# Patient Record
Sex: Female | Born: 1960 | Hispanic: Yes | Marital: Single
Health system: Southern US, Community
[De-identification: ages and names within clinical notes are randomized; demographics above are authoritative.]

## PROBLEM LIST (undated history)

## (undated) DIAGNOSIS — I340 Nonrheumatic mitral (valve) insufficiency: Secondary | ICD-10-CM

## (undated) DIAGNOSIS — I6529 Occlusion and stenosis of unspecified carotid artery: Secondary | ICD-10-CM

## (undated) DIAGNOSIS — I10 Essential (primary) hypertension: Secondary | ICD-10-CM

## (undated) HISTORY — PX: FINGER SURGERY: SHX640

## (undated) HISTORY — DX: Nonrheumatic mitral (valve) insufficiency: I34.0

## (undated) HISTORY — DX: Occlusion and stenosis of unspecified carotid artery: I65.29

---

## 2016-04-10 ENCOUNTER — Encounter: Payer: Self-pay | Admitting: Emergency Medicine

## 2016-04-10 ENCOUNTER — Emergency Department
Admission: EM | Admit: 2016-04-10 | Discharge: 2016-04-10 | Disposition: A | Discharge status: Home / Self Care | Payer: Self-pay | Attending: Emergency Medicine | Admitting: Emergency Medicine

## 2016-04-10 ENCOUNTER — Emergency Department: Payer: Self-pay

## 2016-04-10 DIAGNOSIS — I10 Essential (primary) hypertension: Secondary | ICD-10-CM | POA: Insufficient documentation

## 2016-04-10 DIAGNOSIS — Z79899 Other long term (current) drug therapy: Secondary | ICD-10-CM | POA: Insufficient documentation

## 2016-04-10 DIAGNOSIS — R202 Paresthesia of skin: Secondary | ICD-10-CM | POA: Insufficient documentation

## 2016-04-10 DIAGNOSIS — R000 Tachycardia, unspecified: Secondary | ICD-10-CM | POA: Insufficient documentation

## 2016-04-10 HISTORY — DX: Essential (primary) hypertension: I10

## 2016-04-10 LAB — BASIC METABOLIC PANEL
ANION GAP: 13 (ref 5–15)
BUN: 12 mg/dL (ref 6–20)
CALCIUM: 9.9 mg/dL (ref 8.9–10.3)
CO2: 23 mmol/L (ref 22–32)
Chloride: 101 mmol/L (ref 101–111)
Creatinine, Ser: 0.71 mg/dL (ref 0.44–1.00)
GFR calc non Af Amer: >60 mL/min (ref >60)
Glucose, Bld: 167 mg/dL — ABNORMAL HIGH (ref 65–99)
POTASSIUM: 2.6 mmol/L — AB (ref 3.5–5.1)
SODIUM: 137 mmol/L (ref 135–145)

## 2016-04-10 LAB — CBC
HEMATOCRIT: 44.2 % (ref 35.0–47.0)
HEMOGLOBIN: 15.1 g/dL (ref 12.0–16.0)
MCH: 30 pg (ref 26.0–34.0)
MCHC: 34.3 g/dL (ref 32.0–36.0)
MCV: 87.5 fL (ref 80.0–100.0)
Platelets: 266 10*3/uL (ref 150–440)
RBC: 5.05 MIL/uL (ref 3.80–5.20)
RDW: 13 % (ref 11.5–14.5)
WBC: 10 10*3/uL (ref 3.6–11.0)

## 2016-04-10 LAB — URINALYSIS, COMPLETE (UACMP) WITH MICROSCOPIC
Bilirubin Urine: NEGATIVE
Glucose, UA: NEGATIVE mg/dL
HGB URINE DIPSTICK: NEGATIVE
KETONES UR: NEGATIVE mg/dL
Nitrite: NEGATIVE
PH: 6 (ref 5.0–8.0)
Protein, ur: NEGATIVE mg/dL
SPECIFIC GRAVITY, URINE: 1.01 (ref 1.005–1.030)

## 2016-04-10 MED ORDER — ATENOLOL 25 MG PO TABS
25.0000 mg | ORAL_TABLET | Freq: Once | ORAL | Status: AC
  Administered 2016-04-10: 25 mg via ORAL
  Filled 2016-04-10: qty 1

## 2016-04-10 MED ORDER — POTASSIUM CHLORIDE CRYS ER 20 MEQ PO TBCR
40.0000 meq | EXTENDED_RELEASE_TABLET | Freq: Once | ORAL | Status: AC
  Administered 2016-04-10: 40 meq via ORAL
  Filled 2016-04-10: qty 2

## 2016-04-10 MED ORDER — SODIUM CHLORIDE 0.9 % IV BOLUS (SEPSIS)
1000.0000 mL | Freq: Once | INTRAVENOUS | Status: AC
Start: 2016-04-10 — End: 2016-04-10
  Administered 2016-04-10: 1000 mL via INTRAVENOUS

## 2016-04-10 MED ORDER — ATENOLOL 25 MG PO TABS: 25.0000 mg | ORAL_TABLET | Freq: Every day | ORAL | 11 refills | Status: AC

## 2016-04-10 MED ORDER — SODIUM CHLORIDE 0.9 % IV BOLUS (SEPSIS)
1000.0000 mL | Freq: Once | INTRAVENOUS | Status: AC
  Administered 2016-04-10: 1000 mL via INTRAVENOUS

## 2016-04-10 NOTE — Discharge Instructions (Signed)
Please seek medical attention for any high fevers, chest pain, shortness of breath, change in behavior, persistent vomiting, bloody stool or any other new or concerning symptoms.  

## 2016-04-10 NOTE — ED Notes (Signed)
Interpretor requested for MD to update pt

## 2016-04-10 NOTE — ED Provider Notes (Signed)
[facility] Emergency Department Provider Note   ____________________________________________   I have reviewed the triage vital signs and the nursing notes.   HISTORY  Chief Complaint Numbness   History limited by: Language [facility] utilized   HPI Victoria Alvarado is a 56 y.o. female who presents to the emergency department today because of concerns for left sided paresthesias and headache. Patient states that she has had on and off left foot numbness and tingling. Sounds like this has been going on for some years. For the past week however the numbness has been constant. She describes it as a sensation of her foot falling asleep. During the same time she has had intermittent left upper extremity tingling. The patient additionally has had a headache on the left side for the past 2 days. She did get her primary care doctor yesterday and was prescribed medication for high blood pressure. No recent fevers.   Past Medical History:  Diagnosis Date  . Hypertension     There are no active problems to display for this patient.   History reviewed. No pertinent surgical history.  Prior to Admission medications   Not on File    Allergies Patient has no known allergies.  History reviewed. No pertinent family history.  Social History Social History  Substance Use Topics  . Smoking status: Never Smoker  . Smokeless tobacco: Never Used  . Alcohol use No    Review of Systems  Constitutional: Negative for fever. Cardiovascular: Negative for chest pain. Respiratory: Negative for shortness of breath. Gastrointestinal: Negative for abdominal pain, vomiting and diarrhea. Genitourinary: Negative for dysuria. Musculoskeletal: Negative for back pain. Skin: Negative for rash. Neurological: Positive for headache. Positive for left upper and lower extremity paraesthesias.   10-point ROS otherwise  negative.  ____________________________________________   PHYSICAL EXAM:  VITAL SIGNS: ED Triage Vitals  Enc Vitals Group     BP 04/10/16 1324 (!) 195/105     Pulse Rate 04/10/16 1324 (!) 140     Resp 04/10/16 1324 18     Temp 04/10/16 1324 98.6 F (37 C)     Temp Source 04/10/16 1324 Oral     SpO2 04/10/16 1324 100 %     Weight 04/10/16 1325 159 lb (72.1 kg)     Height 04/10/16 1325 5\' 3"  (1.6 m)     Head Circumference --      Peak Flow --      Pain Score 04/10/16 1323 7   Constitutional: Alert and oriented. Well appearing and in no distress. Eyes: Conjunctivae are normal. Normal extraocular movements. ENT   Head: Normocephalic and atraumatic.   Nose: No congestion/rhinnorhea.   Mouth/Throat: Mucous membranes are moist.   Neck: No stridor. Hematological/Lymphatic/Immunilogical: No cervical lymphadenopathy. Cardiovascular: Tachycardic, regular rhythm.  No murmurs, rubs, or gallops.  Respiratory: Normal respiratory effort without tachypnea nor retractions. Breath sounds are clear and equal bilaterally. No wheezes/rales/rhonchi. Gastrointestinal: Soft and non tender. No rebound. No guarding.  Genitourinary: Deferred Musculoskeletal: Normal range of motion in all extremities. No lower extremity edema. Neurologic:  Normal speech and language. No gross focal neurologic deficits are appreciated.  Skin:  Skin is warm, dry and intact. No rash noted. Psychiatric: Mood and affect are normal. Speech and behavior are normal. Patient exhibits appropriate insight and judgment.  ____________________________________________    LABS (pertinent positives/negatives)  Labs Reviewed  BASIC METABOLIC PANEL - Abnormal; Notable for the following:       Result Value  Potassium 2.6 (*)    Glucose, Bld 167 (*)    All other components within normal limits  URINALYSIS, COMPLETE (UACMP) WITH MICROSCOPIC - Abnormal; Notable for the following:    Color, Urine YELLOW (*)     APPearance CLEAR (*)    Leukocytes, UA TRACE (*)    Bacteria, UA RARE (*)    Squamous Epithelial / LPF 0-5 (*)    All other components within normal limits  CBC  CBG MONITORING, ED     ____________________________________________   EKG  I, Phineas Semen, attending physician, personally viewed and interpreted this EKG  EKG Time: 1331 Rate: 131 Rhythm: sinus tachycardia Axis: normal Intervals: qtc 428 QRS: narrow ST changes: no st elevation Impression: abnormal ekg   ____________________________________________    RADIOLOGY  CT head IMPRESSION:  No acute intracranial abnormality. 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     ____________________________________________   PROCEDURES  Procedures  ____________________________________________   INITIAL IMPRESSION / ASSESSMENT AND PLAN / ED COURSE  Pertinent labs & imaging results that were available during my care of the patient were reviewed by me and considered in my medical decision making (see chart for details).  Patient presented to the emergency department today with concerns for paresthesias. At CT here showed L although no other acute findings. Patient did have a low potassium and was given potassium repletion. She stated she did feel better over the course of her stay here in the emergency department. Of note her heart rate did continue to remain high. This point no concerning findings for infection. It did improve slightly with fluids however did start atenolol and instruct patient follow up with cardiology.  ____________________________________________   FINAL CLINICAL IMPRESSION(S) / ED DIAGNOSES  Final diagnoses:  Paresthesia  Tachycardia     Note: This dictation was prepared with Dragon dictation. Any transcriptional errors that result from this process are unintentional     Phineas Semen, MD 04/10/16 2026

## 2016-04-10 NOTE — ED Notes (Signed)
Discharge done with assistance of Nadean CorwinHiram Cutie, [place] interpreter.

## 2016-04-10 NOTE — ED Notes (Signed)
Date and time results received: 04/10/16 1402 (use smartphrase ".now" to insert current time)  Test: potassium Critical Value: 2.6  Name of Provider Notified: goodman

## 2016-04-10 NOTE — ED Triage Notes (Signed)
Pt to ed with c/o numbness and tingling in left foot and left hand that started about 1 week ago, then headache that started 2 days ago, worse today on left side of head. Pt states started HTN meds yesterday.

## 2016-04-10 NOTE — ED Notes (Signed)
Pt discharged to home.  Discharge instructions reviewed.  Verbalized understanding.  No questions or concerns at this time.  Teach back verified.  Pt in NAD.  No items left in ED.   

## 2016-04-12 ENCOUNTER — Encounter: Payer: Self-pay | Admitting: Internal Medicine

## 2016-04-12 ENCOUNTER — Ambulatory Visit (INDEPENDENT_AMBULATORY_CARE_PROVIDER_SITE_OTHER): Payer: Self-pay | Admitting: Internal Medicine

## 2016-04-12 VITALS — BP 128/78 | HR 77 | Ht 63.0 in | Wt 157.8 lb

## 2016-04-12 DIAGNOSIS — R202 Paresthesia of skin: Secondary | ICD-10-CM

## 2016-04-12 DIAGNOSIS — R000 Tachycardia, unspecified: Secondary | ICD-10-CM

## 2016-04-12 NOTE — Progress Notes (Signed)
New Outpatient Visit Date: 04/12/2016  Referring Provider: Phineas Semen, MD [facility] Emergency Department  Chief Complaint: elevated heart rate  HPI:  Ms. Victoria Alvarado is a 56 y.o. year-old female with history of hypertension, who has been referred by Dr. Derrill Kay for evaluation of sinus tachycardia. The patient presented to the [facility] ED on 04/10/16 with paresthesias. She was noted to have an elevated heart rate with sinus tachycardia. Workup was notable for hypokalemia. Heart rate improved with IV fluids. The patient was discharged on atenolol and referred for cardiology evaluation. The patient reports numbness and tingling involving the left arm and leg that began last week. She has also experienced intermittent sharp pains along the left side of her head. Two days ago, while at work, the patient began feeling "odd." She also felt as though her heart was racing. She left early and presented to the [facility] ED, where evaluation was notable for sinus tachycardia in the setting of hypokalemia. Head CT without contrast showed enlargement of the sella turcica compatible with empty sella but no acute abnormalities. The patient received IV fluids and potassium; she was sent home on atenolol.  Of note, the patient had recently been started on HCTZ and metonidazole for hypertension and vaginal infection by her PCP. She had not consumed EtOH since beginning these medications. She denies chest pain,lightheadedness, edema, orthopnea, PND, and claudication. However, she has experienced mild shortness of breath when she gets "agitated" or during intercourse. She has not had any heart problems in the past. She denies prior cardiovascular work-up. She denies focal weakness and amaurosis fugax. She consumes 2 cups of caffeinated coffee per day.  --------------------------------------------------------------------------------------------------  Cardiovascular History & Procedures: Cardiovascular Problems:  Sinus  tachycardia  Risk Factors:  Hypertension  Cath/PCI:  None  CV Surgery:  None  EP Procedures and Devices:  None  Non-Invasive Evaluation(s):  None  Recent CV Pertinent Labs: Lab Results  Component Value Date   K 2.6 (LL) 04/10/2016   BUN 12 04/10/2016   CREATININE 0.71 04/10/2016    --------------------------------------------------------------------------------------------------  Past Medical History:  Diagnosis Date  . Hypertension     Past Surgical History:  Procedure Laterality Date  . FINGER SURGERY      Outpatient Encounter Prescriptions as of 04/12/2016  Medication Sig  . atenolol (TENORMIN) 25 MG tablet Take 1 tablet (25 mg total) by mouth daily.  . hydrochlorothiazide (MICROZIDE) 12.5 MG capsule Take 12.5 mg by mouth daily.  . metroNIDAZOLE (FLAGYL) 500 MG tablet Take 500 mg by mouth 2 (two) times daily.   No facility-administered encounter medications on file as of 04/12/2016.     Allergies: Patient has no known allergies.  Social History   Social History  . Marital status: Single    Spouse name: N/A  . Number of children: N/A  . Years of education: N/A   Occupational History  . Not on file.   Social History Main Topics  . Smoking status: Passive Smoke Exposure - Never Smoker  . Smokeless tobacco: Never Used  . Alcohol use Yes     Comment: occassion  . Drug use: No  . Sexual activity: Not on file   Other Topics Concern  . Not on file   Social History Narrative  . No narrative on file    Family History  Problem Relation Age of Onset  . Heart Problems Mother     Review of Systems: A 12-system review of systems was performed and was negative except as noted in the HPI.  --------------------------------------------------------------------------------------------------  Physical Exam: BP 128/78 (BP Location: Right Arm, Patient Position: Sitting, Cuff Size: Normal)   Pulse 77   Ht 5\' 3"  (1.6 m)   Wt 157 lb 12 oz (71.6 kg)    BMI 27.94 kg/m   General:  Overweight woman, seated comfortably in the exam room. She is accompanied by a [place] interpreter. HEENT: No conjunctival pallor or scleral icterus.  Moist mucous membranes.  OP clear. Neck: Supple without lymphadenopathy, thyromegaly, JVD, or HJR.  No carotid bruit. Lungs: Normal work of breathing.  Clear to auscultation bilaterally without wheezes or crackles. Heart: Regular rate and rhythm without murmurs, rubs, or gallops.  Non-displaced PMI. Abd: Bowel sounds present.  Soft, NT/ND without hepatosplenomegaly Ext: No lower extremity edema.  Radial, PT, and DP pulses are 2+ bilaterally Skin: warm and dry without rash Neuro: CNIII-XII intact.  Decreased fine-touch sensation in the left upper and lower extremities. 5/5 strength in the upper and lower extremities. Psych: Normal mood and affect.  EKG:  Normal sinus rhythm (HR 77 bpm) without abnormalities. Heart rate has decreased from prior tracing on 04/11/11 (I have personally reviewed both tracings).  Lab Results  Component Value Date   WBC 10.0 04/10/2016   HGB 15.1 04/10/2016   HCT 44.2 04/10/2016   MCV 87.5 04/10/2016   PLT 266 04/10/2016    Lab Results  Component Value Date   NA 137 04/10/2016   K 2.6 (LL) 04/10/2016   CL 101 04/10/2016   CO2 23 04/10/2016   BUN 12 04/10/2016   CREATININE 0.71 04/10/2016   GLUCOSE 167 (H) 04/10/2016    No results found for: CHOL, HDL, LDLCALC, LDLDIRECT, TRIG, CHOLHDL  --------------------------------------------------------------------------------------------------  ASSESSMENT AND PLAN: Sinus tachycardia Heart rate is normal today, albeit with the patient on a beta-blocker. I am not sure what precipitated her episode of sinus tachycardia earlier this week. However, sinus tachycardia is typically a response to a physiologic or emotional stressor, rather than a primary cardiac rhythm disturbance. Given recent addition of HCTZ and hypokalemia on admission, it  is possible that intravascular volume depletion and electrolyte disturbances may have been contributing to this. We will repeat a CMP today and also check a Mg level and TSH. We will obtain a transthoracic echocardiogram to exclude structural abnormalities, given the patient's report of mild shortness of breath. Her exam today is unrevealing without murmurs or signs of heart failure.  Paresthesias This has been present for about a week without clear precipitant. Electrolyte abnormalities could have contributed. Physical exam is notable only for decreased fine-touch sensation throughout the left side of her body. There is no weakness or other focal neurologic deficit. We have agreed to obtain carotid Dopplers, TSH, CMP, and hemoglobin A1c (in case this could be an unusual presentation of early diabetic neuropathy). I will defer further work-up to the patient's PCP, including suggestion of empty sella noted on recent head CT.  Follow-up: Return to clinic in 4-6 weeks.  Yvonne Kendallhristopher Bernell Haynie, MD 04/12/2016 9:23 AM

## 2016-04-12 NOTE — Patient Instructions (Addendum)
Medication Instructions:  Your physician recommends that you continue on your current medications as directed. Please refer to the Current Medication list given to you today.   Labwork: Your physician recommends that you return for lab work in: TODAY (CMP, Mg, TSH, Hgb A1c).   Testing/Procedures: Your physician has requested that you have an echocardiogram. Echocardiography is a painless test that uses sound waves to create images of your heart. It provides your doctor with information about the size and shape of your heart and how well your heart's chambers and valves are working. This procedure takes approximately one hour. There are no restrictions for this procedure.  Your physician has requested that you have a carotid duplex. This test is an ultrasound of the carotid arteries in your neck. It looks at blood flow through these arteries that supply the brain with blood. Allow one hour for this exam. There are no restrictions or special instructions.    Follow-Up: Your physician recommends that you schedule a follow-up appointment in: 4-6 WEEKS AFTER TESTING COMPLETED.   Ecocardiograma (Echocardiogram) El ecocardiograma utiliza ondas sonoras (ultrasonido) para obtener una imagen del corazn. El ecocardiograma es simple e indoloro, se obtiene en un perodo de tiempo breve y proporciona informacin valiosa a su mdico. Las imgenes de un ecocardiograma pueden proporcionar el siguiente tipo de informacin:  Evidencia de enfermedad arterial coronaria (EAC).  Tamao del corazn.  Funcin del msculo cardaco.  Funcin de la vlvula cardaca.  Deteccin de aneurisma.  Evidencia de un infarto de miocardio pasado.  Acumulacin de lquido alrededor del corazn.  Engrosamiento del msculo cardaco.  Evaluacin de la funcin de la vlvula cardaca. INFORME A SU MDICO:  Cualquier alergia que tenga.  Todos los Walt Disneymedicamentos que utiliza, incluidos vitaminas, hierbas, gotas  oftlmicas, cremas y [address]icamentos de [address] libre.  Problemas previos que usted o los Graybar Electricmiembros de su familia hayan tenido con el uso de anestsicos.  Enfermedades de la sangre que tenga.  Cirugas previas.  Enfermedades que tenga.  Posibilidad de embarazo, si corresponde. ANTES DEL PROCEDIMIENTO No se requiere una preparacin especial. Coma y beba con normalidad. PROCEDIMIENTO  Para lograr una imagen del corazn, se aplicar un gel en el pecho y luego se pasar un instrumento similar a una vara (transductor) sobre este. El gel ayudar a transmitir las Corning Incorporatedondas sonoras del transductor. Las ondas sonoras rebotarn inofensivamente en el corazn para permitir capturar las imgenes del corazn en movimiento, en tiempo real. Luego, las imgenes se Agricultural engineerregistrarn.  Quiz necesite una va IV para recibir un medicamento que mejore la calidad de las imgenes. DESPUS DEL PROCEDIMIENTO Puede retomar su rutina normal, incluidos la dieta, las actividades y Pulte Homeslos medicamentos, a menos que su mdico le indique lo contrario. Esta informacin no tiene Theme park managercomo fin reemplazar el consejo del mdico. Asegrese de hacerle al mdico cualquier pregunta que tenga. Document Released: 01/02/2005 Document Revised: 01/23/2014 Document Reviewed: 09/09/2012 Elsevier Interactive Patient Education  2017 Elsevier Inc.    Echocardiogram An echocardiogram, or echocardiography, uses sound waves (ultrasound) to produce an image of your heart. The echocardiogram is simple, painless, obtained within a short period of time, and offers valuable information to your health care provider. The images from an echocardiogram can provide information such as:  Evidence of coronary artery disease (CAD).  Heart size.  Heart muscle function.  Heart valve function.  Aneurysm detection.  Evidence of a past heart attack.  Fluid buildup around the heart.  Heart muscle thickening.  Assess heart valve function. Tell a health care  provider  about:  Any allergies you have.  All medicines you are taking, including vitamins, herbs, eye drops, creams, and over-the-counter medicines.  Any problems you or family members have had with anesthetic medicines.  Any blood disorders you have.  Any surgeries you have had.  Any medical conditions you have.  Whether you are pregnant or may be pregnant. What happens before the procedure? No special preparation is needed. Eat and drink normally. What happens during the procedure?  In order to produce an image of your heart, gel will be applied to your chest and a wand-like tool (transducer) will be moved over your chest. The gel will help transmit the sound waves from the transducer. The sound waves will harmlessly bounce off your heart to allow the heart images to be captured in real-time motion. These images will then be recorded.  You may need an IV to receive a medicine that improves the quality of the pictures. What happens after the procedure? You may return to your normal schedule including diet, activities, and medicines, unless your health care provider tells you otherwise. This information is not intended to replace advice given to you by your health care provider. Make sure you discuss any questions you have with your health care provider. Document Released: 12/31/1999 Document Revised: 08/21/2015 Document Reviewed: 09/09/2012 Elsevier Interactive Patient Education  2017 [employer].

## 2016-04-13 DIAGNOSIS — R000 Tachycardia, unspecified: Secondary | ICD-10-CM | POA: Insufficient documentation

## 2016-04-13 DIAGNOSIS — R202 Paresthesia of skin: Secondary | ICD-10-CM | POA: Insufficient documentation

## 2016-04-13 LAB — COMPREHENSIVE METABOLIC PANEL
ALBUMIN: 4.7 g/dL (ref 3.5–5.5)
ALK PHOS: 136 IU/L — AB (ref 39–117)
ALT: 32 IU/L (ref 0–32)
AST: 27 IU/L (ref 0–40)
Albumin/Globulin Ratio: 1.6 (ref 1.2–2.2)
BILIRUBIN TOTAL: 0.3 mg/dL (ref 0.0–1.2)
BUN/Creatinine Ratio: 18 (ref 9–23)
BUN: 13 mg/dL (ref 6–24)
CALCIUM: 9.8 mg/dL (ref 8.7–10.2)
CO2: 21 mmol/L (ref 18–29)
CREATININE: 0.72 mg/dL (ref 0.57–1.00)
Chloride: 101 mmol/L (ref 96–106)
GFR calc non Af Amer: 95 mL/min/{1.73_m2} (ref >59)
GFR, EST AFRICAN AMERICAN: 110 mL/min/{1.73_m2} (ref >59)
GLOBULIN, TOTAL: 2.9 g/dL (ref 1.5–4.5)
Glucose: 128 mg/dL — ABNORMAL HIGH (ref 65–99)
Potassium: 3.8 mmol/L (ref 3.5–5.2)
Sodium: 141 mmol/L (ref 134–144)
TOTAL PROTEIN: 7.6 g/dL (ref 6.0–8.5)

## 2016-04-13 LAB — MAGNESIUM: MAGNESIUM: 1.9 mg/dL (ref 1.6–2.3)

## 2016-04-13 LAB — TSH: TSH: 3.42 u[IU]/mL (ref 0.450–4.500)

## 2016-04-13 LAB — HEMOGLOBIN A1C
Est. average glucose Bld gHb Est-mCnc: 120 mg/dL
Hgb A1c MFr Bld: 5.8 % — ABNORMAL HIGH (ref 4.8–5.6)

## 2016-05-01 ENCOUNTER — Ambulatory Visit
Admission: RE | Admit: 2016-05-01 | Discharge: 2016-05-01 | Disposition: A | Discharge status: Home / Self Care | Payer: Self-pay | Source: Ambulatory Visit | Attending: Oncology | Admitting: Oncology

## 2016-05-01 ENCOUNTER — Ambulatory Visit: Payer: Self-pay | Attending: Oncology

## 2016-05-01 VITALS — BP 126/78 | HR 73 | Temp 98.4°F | Ht 61.0 in | Wt 156.0 lb

## 2016-05-01 DIAGNOSIS — Z0000 Encounter for general adult medical examination without abnormal findings: Secondary | ICD-10-CM

## 2016-05-01 NOTE — Progress Notes (Signed)
Subjective:     Patient ID: Victoria Alvarado, female   DOB: 06-08-1960, 56 y.o.   MRN: [id number]  HPI   Review of Systems     Objective:   Physical Exam  Pulmonary/Chest: Right breast exhibits no inverted nipple, no mass, no nipple discharge, no skin change and no tenderness. Left breast exhibits no inverted nipple, no mass, no nipple discharge, no skin change and no tenderness. Breasts are symmetrical.    Red flat spot mid sternum; two circular bruises outer left breast       Assessment:     56 year old patient presents for BCCCP clinic visitPatient screened, and meets BCCCP eligibility.  Patient does not have insurance, Medicare or Medicaid.  Handout given on Affordable Care Act. Instructed patient on breast self-exam using teach back method.  CBE unremarkable.  Patient reports mid-strnal red area "has always been there".  States circular bruising on left breast has been there one week. Denies any breast injury, but does heavy lifting at work, and reports some back pain.   No mass or lump palpated. She is seeing her primary physician next Monday.  Intstructed to follow-up with PCP.    Plan:     Sent for bilateral screening mammogram.

## 2016-05-15 ENCOUNTER — Other Ambulatory Visit: Payer: Self-pay | Admitting: Internal Medicine

## 2016-05-15 DIAGNOSIS — R202 Paresthesia of skin: Secondary | ICD-10-CM

## 2016-05-16 ENCOUNTER — Other Ambulatory Visit: Payer: Self-pay | Admitting: *Deleted

## 2016-05-16 DIAGNOSIS — N63 Unspecified lump in unspecified breast: Secondary | ICD-10-CM

## 2016-05-16 DIAGNOSIS — I340 Nonrheumatic mitral (valve) insufficiency: Secondary | ICD-10-CM

## 2016-05-16 HISTORY — DX: Nonrheumatic mitral (valve) insufficiency: I34.0

## 2016-05-18 ENCOUNTER — Ambulatory Visit
Admission: RE | Admit: 2016-05-18 | Discharge: 2016-05-18 | Disposition: A | Discharge status: Home / Self Care | Payer: Self-pay | Source: Ambulatory Visit | Attending: Oncology | Admitting: Oncology

## 2016-05-18 DIAGNOSIS — N63 Unspecified lump in unspecified breast: Secondary | ICD-10-CM

## 2016-05-22 ENCOUNTER — Ambulatory Visit: Payer: Self-pay

## 2016-05-22 DIAGNOSIS — R202 Paresthesia of skin: Secondary | ICD-10-CM

## 2016-05-22 DIAGNOSIS — R000 Tachycardia, unspecified: Secondary | ICD-10-CM

## 2016-05-22 LAB — ECHOCARDIOGRAM COMPLETE
CHL CUP STROKE VOLUME: 51 mL
CHL CUP TV REG PEAK VELOCITY: 198 cm/s
E decel time: 238 msec
E/e' ratio: 8.28
FS: 40 % (ref 28–44)
IV/PV OW: 1.1
LA ID, A-P, ES: 26 mm
LA diam index: 1.53 cm/m2
LA vol A4C: 51.7 ml
LAVOL: 54.8 mL
LAVOLIN: 32.2 mL/m2
LDCA: 2.84 cm2
LEFT ATRIUM END SYS DIAM: 26 mm
LV E/e' medial: 8.28
LV E/e'average: 8.28
LV TDI E'LATERAL: 11.6
LV dias vol index: 37 mL/m2
LV sys vol index: 7 mL/m2
LV sys vol: 12 mL — AB (ref 14–42)
LVDIAVOL: 62 mL (ref 46–106)
LVELAT: 11.6 cm/s
LVOT VTI: 26.5 cm
LVOT diameter: 19 mm
LVOTSV: 75 mL
Lateral S' vel: 13.2 cm/s
MV Dec: 238
MV Peak grad: 4 mmHg
MV pk A vel: 82.5 m/s
MV pk E vel: 96 m/s
PW: 10 mm — AB (ref 0.6–1.1)
Simpson's disk: 81
TAPSE: 26.5 mm
TDI e' medial: 10.1
TR max vel: 198 cm/s

## 2016-05-22 LAB — VAS US CAROTID
LCCAPDIAS: 20 cm/s
LEFT ECA DIAS: -15 cm/s
LEFT VERTEBRAL DIAS: 15 cm/s
Left CCA dist dias: 23 cm/s
Left CCA dist sys: 84 cm/s
Left CCA prox sys: 107 cm/s
Left ICA dist dias: -46 cm/s
Left ICA dist sys: -118 cm/s
Left ICA prox dias: -20 cm/s
Left ICA prox sys: -66 cm/s
RCCAPDIAS: 0 cm/s
RIGHT ECA DIAS: -14 cm/s
RIGHT VERTEBRAL DIAS: -15 cm/s
Right CCA prox sys: -157 cm/s
Right cca dist sys: -95 cm/s

## 2016-05-24 NOTE — Progress Notes (Unsigned)
Letter mailed from [facility] to notify of normal mammogram results.  Patient to return in one year for annual screening.  Copy to [facility]. 

## 2016-05-31 ENCOUNTER — Ambulatory Visit (INDEPENDENT_AMBULATORY_CARE_PROVIDER_SITE_OTHER): Payer: Self-pay | Admitting: Internal Medicine

## 2016-05-31 ENCOUNTER — Encounter: Payer: Self-pay | Admitting: Internal Medicine

## 2016-05-31 VITALS — BP 140/78 | HR 84 | Ht 63.0 in | Wt 147.0 lb

## 2016-05-31 DIAGNOSIS — I6521 Occlusion and stenosis of right carotid artery: Secondary | ICD-10-CM

## 2016-05-31 DIAGNOSIS — I10 Essential (primary) hypertension: Secondary | ICD-10-CM

## 2016-05-31 DIAGNOSIS — Z13220 Encounter for screening for lipoid disorders: Secondary | ICD-10-CM

## 2016-05-31 DIAGNOSIS — R000 Tachycardia, unspecified: Secondary | ICD-10-CM

## 2016-05-31 DIAGNOSIS — R0789 Other chest pain: Secondary | ICD-10-CM

## 2016-05-31 DIAGNOSIS — R002 Palpitations: Secondary | ICD-10-CM

## 2016-05-31 MED ORDER — ASPIRIN EC 81 MG PO TBEC: 81.0000 mg | DELAYED_RELEASE_TABLET | Freq: Every day | ORAL | 3 refills | Status: DC

## 2016-05-31 NOTE — Patient Instructions (Addendum)
Medication Instructions:  Your physician has recommended you make the following change in your medication:  1- START TAKING Aspirin 81 mg by mouth once a day.   Labwork: Your physician recommends that you return for lab work in: TODAY (check cholesterol.).   Testing/Procedures: Your physician has requested that you have an exercise tolerance test. For further information please visit [email or web address]. Please also follow instruction sheet, as given.  - DO NOT TAKE YOUR Atenolol the night before or morning of the test.   DO NOT drink or eat foods with caffeine for 24 hours before the test. (Chocolate, coffee, tea, decaf coffee/tea, or energy drinks)  DO NOT smoke for 4 hours before your test.  If you use an inhaler, bring it with you to the test.  Wear comfortable shoes and clothing. Women do not wear dresses.    Follow-Up: Your physician recommends that you schedule a follow-up appointment in: 2 MONTHS WITH DR END.  If you need a refill on your cardiac medications before your next appointment, please call your pharmacy.   Electrocardiograma de esfuerzo (Exercise Stress Electrocardiogram) Un electrocardiograma de esfuerzo es un estudio para controlar cmo circula la sangre hacia el corazn. El objetivo es buscar zonas con un flujo sanguneo deficiente. En Regions Financial Corporationeste estudio, deber caminar sobre una cinta Soliscaminadora. El Customer service managerelectrocardiograma registrar los latidos cardacos cuando est en reposo y cuando haga ejercicio. ANTES DEL PROCEDIMIENTO  No consuma bebidas ni alimentos con cafena durante las 24horas previas al estudio, o como se lo haya indicado el mdico. Esto incluye caf, t (incluso t descafeinado), gaseosas, chocolate y cacao.  Siga las instrucciones del mdico respecto de lo que debe comer y beber antes del [place]estudio.  Consulte a su mdico qu medicamentos debe o no debe tomar antes del estudio. Tome los medicamentos con agua, a menos que el mdico le indique lo  contrario.  Si [place]ausa un inhalador, Nurse, mental healthtrigalo en el momento del [place]estudio.  Traiga un bocadillo para comer despus de la prueba.  No fume durante las 4horas previas al estudio.  No se aplique lociones, polvos, cremas ni aceites en el pecho antes del estudio.  Use ropa y calzado cmodos. PROCEDIMIENTO  Se le colocarn parches sobre el pecho. Es posible que se deban afeitar pequeas zonas del pecho. Se conectarn cables a los parches.  Se controlar su frecuencia cardaca mientras est en reposo y mientras haga ejercicio.  Caminar sobre una Latviacinta caminadora. La cinta caminadora ir aumentando la velocidad lentamente para elevar su frecuencia cardaca.  El estudio durar aproximadamente entre 1 y Multimedia programmer2horas. DESPUS DEL PROCEDIMIENTO  Despus del estudio, se le controlar la frecuencia cardaca y la presin arterial.  Puede retomar una dieta normal, las actividades y los medicamentos, segn las indicaciones del mdico. Esta informacin no tiene Theme park managercomo fin reemplazar el consejo del mdico. Asegrese de hacerle al mdico cualquier pregunta que tenga. Document Released: 12/16/2007 Document Revised: 01/23/2014 Document Reviewed: 09/09/2012 Elsevier Interactive Patient Education  2017 [employer].

## 2016-05-31 NOTE — Progress Notes (Signed)
Follow-up Outpatient Visit Date: 05/31/2016  Primary Care Provider: Herminio Commons, [address] [place] [id number]  Chief Complaint: Follow-up elevated heart rate  HPI:  Ms. Victoria Alvarado is a 56 y.o. year-old female with history of hypertension, who presents for follow-up of sinus tachycardia. I first met Ms. Jose Persia on 04/12/16 after presenting to the [facility] ED with paresthesias. She was noted to have hypokalemia and sinus tachycardia, which improved with IV fluids. We agreed to proceed with TTE and carotid Dopplers (see details below). Since her last visit, the patient has continued to have occasional chest pressure and palpitations. She typically experiences this when she lays on her left side at night. It resolves with moving to a different position. She also feels anxious with vague chest discomfort at times during the day. It is not clearly exertional. She has not had any shortness of breath. She was seen at [facility] after her last visit due to tingling in both arms. Workup at the time, including brain MRI, were unrevealing. Despite a largely negative workup thus far, the patient remains very anxious about her health. She remains on atenolol and HCTZ, which she is tolerating without side effects.  --------------------------------------------------------------------------------------------------  Cardiovascular History & Procedures: Cardiovascular Problems:  Sinus tachycardia  Atypical chest pain  Risk Factors:  Hypertension  Cath/PCI:  None  CV Surgery:  None  EP Procedures and Devices:  None  Non-Invasive Evaluation(s):  TTE (05/22/16): Normal LV size and function with LVEF of 60-65%. No wall motion abnormalities or diastolic dysfunction. Mild MR. Normal RV size and function.  Carotid Doppler (05/22/16): Mild right internal carotid artery stenosis (1-39%). Normal left internal carotid artery. Antegrade vertebral artery flow bilaterally.  Normal subclavian artery flow.  Recent CV Pertinent Labs: Lab Results  Component Value Date   K 3.8 04/12/2016   MG 1.9 04/12/2016   BUN 13 04/12/2016   CREATININE 0.72 04/12/2016    Past medical and surgical history were reviewed and updated in EPIC.  Outpatient Encounter Prescriptions as of 05/31/2016  Medication Sig  . atenolol (TENORMIN) 25 MG tablet Take 1 tablet (25 mg total) by mouth daily.  . Cholecalciferol (VITAMIN D PO) Take by mouth daily.  . hydrochlorothiazide (MICROZIDE) 12.5 MG capsule Take 12.5 mg by mouth daily.  . [DISCONTINUED] metroNIDAZOLE (FLAGYL) 500 MG tablet Take 500 mg by mouth 2 (two) times daily.   No facility-administered encounter medications on file as of 05/31/2016.     Allergies: Patient has no known allergies.  Social History   Social History  . Marital status: Single    Spouse name: N/A  . Number of children: N/A  . Years of education: N/A   Occupational History  . Not on file.   Social History Main Topics  . Smoking status: Passive Smoke Exposure - Never Smoker  . Smokeless tobacco: Never Used  . Alcohol use 0.6 oz/week    1 Cans of beer per week  . Drug use: No  . Sexual activity: Not on file   Other Topics Concern  . Not on file   Social History Narrative  . No narrative on file    Family History  Problem Relation Age of Onset  . Heart Problems Mother   . Stroke Mother   . Thyroid cancer Mother   . Heart attack Father 66    Review of Systems: A 12-system review of systems was performed and was negative except as noted in the HPI.  --------------------------------------------------------------------------------------------------  Physical Exam: BP 140/78 (BP Location: Left Arm, Patient Position: Sitting, Cuff Size: Normal)   Pulse 84   Ht '5\' 3"'  (1.6 m)   Wt 147 lb (66.7 kg)   BMI 26.04 kg/m   General:  Anxious appearing woman, seated in the exam room. She is accompanied by her husband. HEENT: No conjunctival  pallor or scleral icterus.  Moist mucous membranes.  OP clear. Neck: Supple without lymphadenopathy, thyromegaly, JVD, or HJR.  No carotid bruit. Lungs: Normal work of breathing.  Clear to auscultation bilaterally without wheezes or crackles. Heart: Regular rate and rhythm without murmurs, rubs, or gallops.  Non-displaced PMI. Abd: Bowel sounds present.  Soft, NT/ND without hepatosplenomegaly Ext: No lower extremity edema.  Radial, PT, and DP pulses are 2+ bilaterally. Skin: warm and dry without rash  EKG: Normal sinus rhythm without abnormalities.  Lab Results  Component Value Date   WBC 10.0 04/10/2016   HGB 15.1 04/10/2016   HCT 44.2 04/10/2016   MCV 87.5 04/10/2016   PLT 266 04/10/2016    Lab Results  Component Value Date   NA 141 04/12/2016   K 3.8 04/12/2016   CL 101 04/12/2016   CO2 21 04/12/2016   BUN 13 04/12/2016   CREATININE 0.72 04/12/2016   GLUCOSE 128 (H) 04/12/2016   ALT 32 04/12/2016    No results found for: CHOL, HDL, LDLCALC, LDLDIRECT, TRIG, CHOLHDL  --------------------------------------------------------------------------------------------------  ASSESSMENT AND PLAN: Palpitations with history of sinus tachycardia Symptoms occur predominantly when the patient is lying in bed or feels anxious. EKG today is unrevealing. Echocardiogram was notable for mild mitral regurgitation, which does not explain her symptoms. I suspect she has a large component of anxiety driving her symptoms. I encouraged her to speak with her PCP about this. She should continue atenolol. We will not make any other medication changes at this time.  Atypical chest pain Symptoms typically occur when lying in bed on the left side. Though I have a low suspicion for obstructive CAD, the patient is very concerned about her health, particularly given the finding of plaque in the right internal carotid artery. We have therefore agreed to obtain an exercise tolerance test. We will also check a  fasting lipid panel for further risk stratification.  Carotid artery stenosis Mild right internal carotid artery stenosis noted. This does not explain her paresthesias and other symptoms. Nonetheless, I think it is important to optimize her medical therapy to prevent disease progression and lower the risk for stroke and other cardiovascular events. I instructed her to begin taking aspirin 81 mg daily. We will check a baseline lipid panel and likely initiate moderate to high intensity statin therapy based on the results.  Hypertension Blood pressure is mildly elevated today. However, the patient did not take her medications this morning. We will not make any changes at this time.  Follow-up: Return to clinic in 2 months.  Nelva Bush, MD 05/31/2016 9:46 PM

## 2016-06-01 LAB — LIPID PANEL
CHOL/HDL RATIO: 3.2 ratio (ref 0.0–4.4)
Cholesterol, Total: 168 mg/dL (ref 100–199)
HDL: 53 mg/dL (ref >39)
LDL Calculated: 98 mg/dL (ref 0–99)
Triglycerides: 83 mg/dL (ref 0–149)
VLDL Cholesterol Cal: 17 mg/dL (ref 5–40)

## 2016-06-02 ENCOUNTER — Telehealth: Payer: Self-pay | Admitting: Internal Medicine

## 2016-06-02 NOTE — Telephone Encounter (Signed)
Voicemail box has not been set up yet. Calling to remind patient about stress test and medication instructions.

## 2016-06-05 ENCOUNTER — Other Ambulatory Visit: Payer: Self-pay | Admitting: *Deleted

## 2016-06-05 ENCOUNTER — Ambulatory Visit (INDEPENDENT_AMBULATORY_CARE_PROVIDER_SITE_OTHER): Payer: Self-pay

## 2016-06-05 DIAGNOSIS — R000 Tachycardia, unspecified: Secondary | ICD-10-CM

## 2016-06-05 DIAGNOSIS — R0789 Other chest pain: Secondary | ICD-10-CM

## 2016-06-05 MED ORDER — ATORVASTATIN CALCIUM 20 MG PO TABS
20.0000 mg | ORAL_TABLET | Freq: Every day | ORAL | 3 refills | Status: DC
Start: 2016-06-05 — End: 2018-06-21

## 2016-06-06 LAB — EXERCISE TOLERANCE TEST
CHL CUP MPHR: 165 {beats}/min
CSEPED: 2 min
CSEPEW: 4.6 METS
Exercise duration (sec): 57 s
Peak HR: 146 {beats}/min
Percent HR: 88 %
Rest HR: 101 {beats}/min

## 2016-06-07 ENCOUNTER — Other Ambulatory Visit: Payer: Self-pay | Admitting: *Deleted

## 2016-06-07 DIAGNOSIS — R002 Palpitations: Secondary | ICD-10-CM

## 2016-06-07 DIAGNOSIS — R0789 Other chest pain: Secondary | ICD-10-CM

## 2016-06-07 DIAGNOSIS — R000 Tachycardia, unspecified: Secondary | ICD-10-CM

## 2016-06-15 ENCOUNTER — Encounter: Payer: Self-pay | Admitting: *Deleted

## 2016-06-15 ENCOUNTER — Telehealth: Payer: Self-pay | Admitting: *Deleted

## 2016-06-15 ENCOUNTER — Other Ambulatory Visit: Payer: Self-pay | Admitting: *Deleted

## 2016-06-15 DIAGNOSIS — R000 Tachycardia, unspecified: Secondary | ICD-10-CM

## 2016-06-15 DIAGNOSIS — Z01818 Encounter for other preprocedural examination: Secondary | ICD-10-CM

## 2016-06-15 DIAGNOSIS — I10 Essential (primary) hypertension: Secondary | ICD-10-CM

## 2016-06-15 DIAGNOSIS — R079 Chest pain, unspecified: Secondary | ICD-10-CM

## 2016-06-15 NOTE — Telephone Encounter (Signed)
-----   Message from Yvonne Kendallhristopher End, MD sent at 06/07/2016  8:16 AM EDT ----- Please let Victoria Alvarado know that her stress test did not show any EKG changes. However, given her limited exercise capacity, it does not exclude coronary artery disease. I recommend that we perform a pharmacologic myocardial perfusion stress test. Thanks.

## 2016-06-15 NOTE — Telephone Encounter (Signed)
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 Patient verbalized understanding of results and is agreeable to the [facility]iew. Instructions given as follows with the interpreter and patient wrote them down and verbalized understanding. She is also aware to let check in know she needs urine [facility]  [facility] Lexiscan MYOVIEW  Your caregiver has ordered a Stress Test with nuclear imaging. The purpose of this test is to evaluate the blood supply to your heart muscle. This procedure is referred to as a "Non-Invasive Stress Test." This is because other than having an IV started in your vein, nothing is inserted or "invades" your body. Cardiac stress tests are done to find areas of poor blood flow to the heart by determining the extent of coronary artery disease (CAD). Some patients exercise on a treadmill, which naturally increases the blood flow to your heart, while others who are  unable to walk on a treadmill due to physical limitations have a pharmacologic/chemical stress agent called Lexiscan . This medicine will mimic walking on a treadmill by temporarily increasing your coronary blood flow.   Please note: these test may take anywhere between 2-4 hours to complete  [facility]  THE VOLUNTEERS AT THE FIRST DESK WILL DIRECT YOU WHERE TO GO  Date of Procedure:_____06/08/18_______________  Arrival Time for Procedure:________0700am_______________  Instructions regarding medication:   _X_:  Hold ATENOLOL the night before procedure and morning of procedure  ____:  Hold other medications as follows:_____Hydrocholothiazide_____   PLEASE NOTIFY THE OFFICE AT LEAST 24 HOURS IN ADVANCE IF YOU ARE UNABLE TO KEEP YOUR APPOINTMENT.  [phone] AND  PLEASE NOTIFY [facility] AT [facility] AT LEAST 24 HOURS IN ADVANCE IF YOU ARE UNABLE TO KEEP YOUR APPOINTMENT. [phone]  How to prepare for your Myoview test:  1. Do not  eat or drink after midnight 2. No caffeine for 24 hours prior to test 3. No smoking 24 hours prior to test. 4. Your medication may be taken with water.  If your doctor stopped a medication because of this test, do not take that medication. 5. Ladies, please do not wear dresses.  Skirts or pants are appropriate. Please wear a short sleeve shirt. 6. No perfume, cologne or lotion. 7. Wear comfortable walking shoes. No heels!

## 2016-06-21 ENCOUNTER — Other Ambulatory Visit: Payer: Self-pay

## 2016-06-22 ENCOUNTER — Telehealth: Payer: Self-pay | Admitting: Internal Medicine

## 2016-06-22 ENCOUNTER — Telehealth: Payer: Self-pay | Admitting: *Deleted

## 2016-06-22 NOTE — Telephone Encounter (Signed)
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 She verbalized understanding of the following instructions: [facility]  THE VOLUNTEERS AT THE FIRST DESK WILL DIRECT YOU WHERE TO GO  Date of Procedure:_____06/08/18_______________  Arrival Time for Procedure:________0700am_______________  Instructions regarding medication:   _X_:  Hold ATENOLOL the night before procedure and morning of procedure  ____:  Hold other medications as follows:_____Hydrocholothiazide_____   PLEASE NOTIFY THE OFFICE AT LEAST 24 HOURS IN ADVANCE IF YOU ARE UNABLE TO KEEP YOUR APPOINTMENT.  [phone] AND  PLEASE NOTIFY NUCLEAR MEDICINE AT [facility] AT LEAST 24 HOURS IN ADVANCE IF YOU ARE UNABLE TO KEEP YOUR APPOINTMENT. [phone]  How to prepare for your Myoview test:  1. Do not eat or drink after midnight 2. No caffeine for 24 hours prior to test 3. No smoking 24 hours prior to test. 4. Your medication may be taken with water.  If your doctor stopped a medication because of this test, do not take that medication. 5. Ladies, please do not wear dresses.  Skirts or pants are appropriate. Please wear a short sleeve shirt. 6. No perfume, cologne or lotion. 7. Wear comfortable walking shoes. No heels!

## 2016-06-22 NOTE — Telephone Encounter (Signed)
Using [employer], interpreter number [phone]. Attempted to reach patient to remind her of Lexiscan myoview and instructions  . No answer and no voicemail set up, unable to leave message.

## 2016-06-22 NOTE — Telephone Encounter (Signed)
Patient wants to go over pre procedure instruction for nm test tomorrow please call with interpreter line

## 2016-06-23 ENCOUNTER — Encounter
Admission: RE | Admit: 2016-06-23 | Discharge: 2016-06-23 | Disposition: A | Discharge status: Home / Self Care | Payer: Self-pay | Source: Ambulatory Visit | Attending: Internal Medicine | Admitting: Internal Medicine

## 2016-06-23 ENCOUNTER — Other Ambulatory Visit
Admission: RE | Admit: 2016-06-23 | Discharge: 2016-06-23 | Disposition: A | Discharge status: Home / Self Care | Payer: Self-pay | Source: Ambulatory Visit | Attending: Internal Medicine | Admitting: Internal Medicine

## 2016-06-23 ENCOUNTER — Encounter: Payer: Self-pay | Admitting: Internal Medicine

## 2016-06-23 DIAGNOSIS — R079 Chest pain, unspecified: Secondary | ICD-10-CM | POA: Insufficient documentation

## 2016-06-23 DIAGNOSIS — R000 Tachycardia, unspecified: Secondary | ICD-10-CM | POA: Insufficient documentation

## 2016-06-23 DIAGNOSIS — Z01818 Encounter for other preprocedural examination: Secondary | ICD-10-CM

## 2016-06-23 DIAGNOSIS — I10 Essential (primary) hypertension: Secondary | ICD-10-CM | POA: Insufficient documentation

## 2016-06-23 LAB — NM MYOCAR MULTI W/SPECT W/WALL MOTION / EF
CHL CUP NUCLEAR SRS: 11
CHL CUP RESTING HR STRESS: 91 {beats}/min
CSEPHR: 70 %
CSEPPHR: 116 {beats}/min
LV dias vol: 61 mL (ref 46–106)
LV sys vol: 8 mL
SDS: 1
SSS: 6
TID: 0.82

## 2016-06-23 LAB — PREGNANCY, URINE: PREG TEST UR: NEGATIVE

## 2016-06-23 MED ORDER — TECHNETIUM TC 99M TETROFOSMIN IV KIT
13.3700 | PACK | Freq: Once | INTRAVENOUS | Status: AC | PRN
  Administered 2016-06-23: 13.37 via INTRAVENOUS

## 2016-06-23 MED ORDER — TECHNETIUM TC 99M TETROFOSMIN IV KIT
28.5000 | PACK | Freq: Once | INTRAVENOUS | Status: AC | PRN
  Administered 2016-06-23: 28.5 via INTRAVENOUS

## 2016-06-23 MED ORDER — REGADENOSON 0.4 MG/5ML IV SOLN
0.4000 mg | Freq: Once | INTRAVENOUS | Status: AC
  Administered 2016-06-23: 0.4 mg via INTRAVENOUS

## 2016-07-11 ENCOUNTER — Encounter: Payer: Self-pay | Admitting: *Deleted

## 2016-07-14 ENCOUNTER — Encounter: Payer: Self-pay | Admitting: Emergency Medicine

## 2016-07-14 ENCOUNTER — Emergency Department
Admission: EM | Admit: 2016-07-14 | Discharge: 2016-07-14 | Disposition: A | Discharge status: Home / Self Care | Payer: Self-pay | Attending: Emergency Medicine | Admitting: Emergency Medicine

## 2016-07-14 DIAGNOSIS — Y990 Civilian activity done for income or pay: Secondary | ICD-10-CM | POA: Insufficient documentation

## 2016-07-14 DIAGNOSIS — Z7982 Long term (current) use of aspirin: Secondary | ICD-10-CM | POA: Insufficient documentation

## 2016-07-14 DIAGNOSIS — Y939 Activity, unspecified: Secondary | ICD-10-CM | POA: Insufficient documentation

## 2016-07-14 DIAGNOSIS — Z7722 Contact with and (suspected) exposure to environmental tobacco smoke (acute) (chronic): Secondary | ICD-10-CM | POA: Insufficient documentation

## 2016-07-14 DIAGNOSIS — Z79899 Other long term (current) drug therapy: Secondary | ICD-10-CM | POA: Insufficient documentation

## 2016-07-14 DIAGNOSIS — Y9259 Other trade areas as the place of occurrence of the external cause: Secondary | ICD-10-CM | POA: Insufficient documentation

## 2016-07-14 DIAGNOSIS — W208XXA Other cause of strike by thrown, projected or falling object, initial encounter: Secondary | ICD-10-CM | POA: Insufficient documentation

## 2016-07-14 DIAGNOSIS — I10 Essential (primary) hypertension: Secondary | ICD-10-CM | POA: Insufficient documentation

## 2016-07-14 DIAGNOSIS — R51 Headache: Secondary | ICD-10-CM | POA: Insufficient documentation

## 2016-07-14 DIAGNOSIS — S0003XA Contusion of scalp, initial encounter: Secondary | ICD-10-CM

## 2016-07-14 DIAGNOSIS — R519 Headache, unspecified: Secondary | ICD-10-CM

## 2016-07-14 MED ORDER — ACETAMINOPHEN 325 MG PO TABS
650.0000 mg | ORAL_TABLET | Freq: Once | ORAL | Status: AC
  Administered 2016-07-14: 650 mg via ORAL
  Filled 2016-07-14: qty 2

## 2016-07-14 NOTE — ED Provider Notes (Signed)
[facility] Emergency Department Provider Note ____________________________________________  Time seen: 2040  I have reviewed the triage vital signs and the nursing notes.  HISTORY  Chief Complaint  Headache  HPI Victoria Alvarado is a 56 y.o. female present to the ED for evaluation of left-sided scalp pain after a box of product fell off of a shelf onto her head at work yesterday. She was reaching for the box when it fell onto her head. She denies LOC, nausea, vomiting, or laceration. She finished work yesterday, without incident. She presents today, with some concern over a more serious injury. She denies any interim weakness headache, or vision change. She notes some tenderness over the scalp where the box hit. She has not taken any medicine in the interim for pain relief.   Past Medical History:  Diagnosis Date  . Carotid artery occlusion   . Hypertension   . Mitral regurgitation 05/2016   Mild    Patient Active Problem List   Diagnosis Date Noted  . Sinus tachycardia 04/13/2016  . Paresthesia 04/13/2016    Past Surgical History:  Procedure Laterality Date  . FINGER SURGERY      Prior to Admission medications   Medication Sig Start Date End Date Taking? Authorizing Provider  aspirin EC 81 MG tablet Take 1 tablet (81 mg total) by mouth daily. 05/31/16   End, Cristal Deer, MD  atenolol (TENORMIN) 25 MG tablet Take 1 tablet (25 mg total) by mouth daily. 04/10/16 04/10/17  Phineas Semen, MD  atorvastatin (LIPITOR) 20 MG tablet Take 1 tablet (20 mg total) by mouth daily at 6 PM. 06/05/16 09/03/16  End, Cristal Deer, MD  Cholecalciferol (VITAMIN D PO) Take by mouth daily.    [provider]  hydrochlorothiazide (MICROZIDE) 12.5 MG capsule Take 12.5 mg by mouth daily.    [provider]    Allergies Patient has no known allergies.  Family History  Problem Relation Age of Onset  . Heart Problems Mother   . Stroke Mother   .  Thyroid cancer Mother   . Heart attack Father 71    Social History Social History  Substance Use Topics  . Smoking status: Passive Smoke Exposure - Never Smoker  . Smokeless tobacco: Never Used  . Alcohol use 0.6 oz/week    1 Cans of beer per week    Review of Systems  Constitutional: Negative for fever. Eyes: Negative for visual changes. ENT: Negative for sore throat. Cardiovascular: Negative for chest pain. Respiratory: Negative for shortness of breath. Musculoskeletal: Negative for back pain. Skin: Negative for rash. Scalp contusion as above.  Neurological: Negative for focal weakness or numbness. ____________________________________________  PHYSICAL EXAM:  VITAL SIGNS: ED Triage Vitals  Enc Vitals Group     BP 07/14/16 2012 (!) 176/83     Pulse Rate 07/14/16 2012 72     Resp 07/14/16 2012 18     Temp 07/14/16 2012 98.7 F (37.1 C)     Temp Source 07/14/16 2012 Oral     SpO2 07/14/16 2012 99 %     Weight 07/14/16 2012 148 lb (67.1 kg)     Height 07/14/16 2012 5\' 3"  (1.6 m)     Head Circumference --      Peak Flow --      Pain Score 07/14/16 2011 5     Pain Loc --      Pain Edu? --      Excl. in GC? --     Constitutional: Alert  and oriented. Well appearing and in no distress. Head: Normocephalic and atraumatic. No scalp hematoma, abrasion, or laceration. Mild superficial erythema over the left scalp.  Eyes: Conjunctivae are normal. PERRL. Normal extraocular movements and fundi bilaterally. Ears: Canals clear. TMs intact bilaterally. Nose: No congestion/rhinorrhea/epistaxis. Mouth/Throat: Mucous membranes are moist. Neck: Supple. No thyromegaly. Cardiovascular: Normal rate, regular rhythm. Normal distal pulses. Respiratory: Normal respiratory effort. No wheezes/rales/rhonchi. Musculoskeletal: Nontender with normal range of motion in all extremities.  Neurologic:  Normal gait without ataxia. Normal speech and language. No gross focal neurologic deficits are  appreciated. Skin:  Skin is warm, dry and intact. No rash noted. Psychiatric: Mood and affect are normal. Patient exhibits appropriate insight and judgment. ____________________________________________  PROCEDURES  Tylenol 650 mg PO ____________________________________________  INITIAL IMPRESSION / ASSESSMENT AND PLAN / ED COURSE  Patient with minor scalp contusion without signs of hematoma, laceration, or abrasion. No concussion symptoms are reported or present on exam. Her exam is otherwise benign is discharged with instructions to take Tylenol over-the-counter for subjective pain relief. Patient verbalizes understanding and will [facility] as needed. ____________________________________________  FINAL CLINICAL IMPRESSION(S) / ED DIAGNOSES  Final diagnoses:  Acute nonintractable headache, unspecified headache type  Contusion of scalp, initial encounter      Lissa HoardMenshew, Aleric Froelich V Bacon, PA-C 07/14/16 2126    Sharman CheekStafford, Phillip, MD 07/14/16 2312

## 2016-07-14 NOTE — ED Notes (Signed)
Pt ambulatory to desk in NAD, report headache, pt spanish speaking, interpreter paged

## 2016-07-14 NOTE — Discharge Instructions (Signed)
Your exam is normal. Take Tylenol as needed for pain relief. Apply ice to the scalp to reduce pain. Follow-up with your provider as needed.

## 2016-07-14 NOTE — ED Triage Notes (Addendum)
Pt. States while at work yesterday a box had fallen on her head.  Pt. Denies LOC.  Pt. States today HA to area where box struck top of head.  Lt. Side of head, no injuries observed. Pt. States she has not taken anything for HA today.

## 2016-08-02 ENCOUNTER — Encounter: Payer: Self-pay | Admitting: Internal Medicine

## 2016-08-02 ENCOUNTER — Telehealth: Payer: Self-pay | Admitting: Internal Medicine

## 2016-08-02 ENCOUNTER — Ambulatory Visit (INDEPENDENT_AMBULATORY_CARE_PROVIDER_SITE_OTHER): Payer: Self-pay | Admitting: Internal Medicine

## 2016-08-02 VITALS — BP 130/80 | HR 78 | Ht 63.0 in | Wt 144.5 lb

## 2016-08-02 DIAGNOSIS — E785 Hyperlipidemia, unspecified: Secondary | ICD-10-CM | POA: Insufficient documentation

## 2016-08-02 DIAGNOSIS — R000 Tachycardia, unspecified: Secondary | ICD-10-CM

## 2016-08-02 DIAGNOSIS — R0789 Other chest pain: Secondary | ICD-10-CM

## 2016-08-02 DIAGNOSIS — I10 Essential (primary) hypertension: Secondary | ICD-10-CM

## 2016-08-02 DIAGNOSIS — I739 Peripheral vascular disease, unspecified: Secondary | ICD-10-CM

## 2016-08-02 DIAGNOSIS — I779 Disorder of arteries and arterioles, unspecified: Secondary | ICD-10-CM | POA: Insufficient documentation

## 2016-08-02 NOTE — Patient Instructions (Signed)
Medication Instructions:  Your physician recommends that you continue on your current medications as directed. Please refer to the Current Medication list given to you today.   Labwork: Your physician recommends that you return for lab work in: TODAY (CMP, LIPID, Direct LDL).   Testing/Procedures: none  Follow-Up: Your physician wants you to follow-up in: 12 MONTHS WITH DR END. You will receive a reminder letter in the mail two months in advance. If you don't receive a letter, please call our office to schedule the follow-up appointment.   If you need a refill on your cardiac medications before your next appointment, please call your pharmacy.

## 2016-08-02 NOTE — Progress Notes (Signed)
Follow-up Outpatient Visit Date: 08/02/2016  Primary Care Provider: Judeen Hammans, MD [address] [place] [address]  Chief Complaint: Follow-up chest pain and elevated heart rate  HPI:  Ms. Victoria Alvarado is a 56 y.o. year-old female with history of hypertension, who presents for follow-up of chest pain and sinus tachycardia. I last saw the patient on 05/31/16, at which time she was doing okay, though she noted occasional chest pressure, typically when lying on her left side at night. This was often accompanied by palpitations. We subsequently performed an exercise tolerance test, which was inconclusive due to difficulty walking on the treadmill. Subsequent pharmacologic MPI was normal. Today, the patient reports that she is feeling well. She has not had any further chest pain or palpitations. She also denies shortness of breath, lightheadedness, and syncope. She occasionally feels numbness in her toes but otherwise has not had any further paresthesias like what initially brought her to the emergency department in March. She is tolerating her medications well. She notes that HCTZ was increased since her last visit by her PCP. She is currently tolerating atorvastatin  -------------------------------------------------------------------------------------------------- Cardiovascular History & Procedures: Cardiovascular Problems:  Sinus tachycardia  Atypical chest pain  Risk Factors:  Hypertension  Cath/PCI:  None  CV Surgery:  None  EP Procedures and Devices:  None  Non-Invasive Evaluation(s):  Pharmacologic MPI (06/23/16): Normal study without ischemia or scar. Normal LVEF.  Exercise tolerance test (06/05/16): Limited exercise capacity with inability to reach stage II of the Bruce protocol. No significant ST segment/T-wave changes identified. Given poor exercise capacity, intermediate risk test.  TTE (05/22/16): Normal LV size and function with LVEF of  60-65%. No wall motion abnormalities or diastolic dysfunction. Mild MR. Normal RV size and function.  Carotid Doppler (05/22/16): Mild right internal carotid artery stenosis (1-39%). Normal left internal carotid artery. Antegrade vertebral artery flow bilaterally. Normal subclavian artery flow.  Recent CV Pertinent Labs: Lab Results  Component Value Date   CHOL 168 05/31/2016   HDL 53 05/31/2016   LDLCALC 98 05/31/2016   TRIG 83 05/31/2016   CHOLHDL 3.2 05/31/2016   K 3.8 04/12/2016   MG 1.9 04/12/2016   BUN 13 04/12/2016   CREATININE 0.72 04/12/2016    Past medical and surgical history were reviewed and updated in EPIC.  Current Meds  Medication Sig  . aspirin EC 81 MG tablet Take 1 tablet (81 mg total) by mouth daily.  Marland Kitchen atenolol (TENORMIN) 25 MG tablet Take 1 tablet (25 mg total) by mouth daily.  Marland Kitchen atorvastatin (LIPITOR) 20 MG tablet Take 1 tablet (20 mg total) by mouth daily at 6 PM.  . Cholecalciferol (VITAMIN D PO) Take by mouth daily.  . hydrochlorothiazide (HYDRODIURIL) 25 MG tablet Take 25 mg by mouth daily.    Allergies: Patient has no known allergies.  Social History   Social History  . Marital status: Single    Spouse name: N/A  . Number of children: N/A  . Years of education: N/A   Occupational History  . Not on file.   Social History Main Topics  . Smoking status: Passive Smoke Exposure - Never Smoker  . Smokeless tobacco: Never Used  . Alcohol use 0.6 oz/week    1 Cans of beer per week  . Drug use: No  . Sexual activity: Not on file   Other Topics Concern  . Not on file   Social History Narrative  . No narrative on file    Family History  Problem Relation Age of Onset  . Heart Problems Mother   . Stroke Mother   . Thyroid cancer Mother   . Heart attack Father 1872    Review of Systems: A 12-system review of systems was performed and was negative except as noted in the  HPI.  --------------------------------------------------------------------------------------------------  Physical Exam: BP 130/80 (BP Location: Left Arm, Patient Position: Sitting, Cuff Size: Normal)   Pulse 78   Ht 5\' 3"  (1.6 m)   Wt 144 lb 8 oz (65.5 kg)   BMI 25.60 kg/m   General:  Well-developed, well-nourished woman, seated comfortably in the exam room. HEENT: No conjunctival pallor or scleral icterus. Moist mucous membranes.  OP clear. Neck: Supple without lymphadenopathy, thyromegaly, JVD, or HJR. No carotid bruit. Lungs: Normal work of breathing. Clear to auscultation bilaterally without wheezes or crackles. Heart: Regular rate and rhythm without murmurs, rubs, or gallops. Non-displaced PMI. Abd: Bowel sounds present. Soft, NT/ND without hepatosplenomegaly Ext: No lower extremity edema. Radial, PT, and DP pulses are 2+ bilaterally. Skin: Warm and dry without rash.  Lab Results  Component Value Date   WBC 10.0 04/10/2016   HGB 15.1 04/10/2016   HCT 44.2 04/10/2016   MCV 87.5 04/10/2016   PLT 266 04/10/2016    Lab Results  Component Value Date   NA 141 04/12/2016   K 3.8 04/12/2016   CL 101 04/12/2016   CO2 21 04/12/2016   BUN 13 04/12/2016   CREATININE 0.72 04/12/2016   GLUCOSE 128 (H) 04/12/2016   ALT 32 04/12/2016    Lab Results  Component Value Date   CHOL 168 05/31/2016   HDL 53 05/31/2016   LDLCALC 98 05/31/2016   TRIG 83 05/31/2016   CHOLHDL 3.2 05/31/2016    --------------------------------------------------------------------------------------------------  ASSESSMENT AND PLAN: Palpitations and sinus tachycardia Heart rate is normal today. No additional palpitations reported by the patient. She can continue with her current dose of atenolol.  Atypical chest pain The patient denies recurrent since our last visit. Exercise tolerance test was inconclusive due to poor exercise tolerance. However, subsequent pharmacologic myocardial perfusion stress  test was negative. I have a low suspicion for significant CAD. We will continue with primary prevention.  Carotid artery disease Mild carotid artery stenosis noted earlier this year on ultrasound. We will continue with statin therapy. She is tolerating atorvastatin 20 mg daily; I will check a lipid panel and CMP today to ensure appropriate response without effect to the liver.  Hypertension Blood pressure is upper normal today. Given recent escalation of HCTZ, we will check her renal function and electrolytes. No medication changes at this time.  Mitral regurgitation Mild MR noted on echo earlier this year. The patient does not have any signs or symptoms of heart failure. We will continue with blood pressure control and clinical follow-up.  Follow-up: Return to clinic in 1 year.  Yvonne Kendallhristopher Arfa Lamarca, MD 08/02/2016 9:07 AM

## 2016-08-02 NOTE — Telephone Encounter (Signed)
Patient requesting note for work.

## 2016-08-03 LAB — LIPID PANEL
Chol/HDL Ratio: 2.3 ratio (ref 0.0–4.4)
Cholesterol, Total: 146 mg/dL (ref 100–199)
HDL: 64 mg/dL (ref >39)
LDL Calculated: 60 mg/dL (ref 0–99)
Triglycerides: 111 mg/dL (ref 0–149)
VLDL CHOLESTEROL CAL: 22 mg/dL (ref 5–40)

## 2016-08-03 LAB — COMPREHENSIVE METABOLIC PANEL
A/G RATIO: 1.9 (ref 1.2–2.2)
ALBUMIN: 4.6 g/dL (ref 3.5–5.5)
ALK PHOS: 158 IU/L — AB (ref 39–117)
ALT: 50 IU/L — ABNORMAL HIGH (ref 0–32)
AST: 40 IU/L (ref 0–40)
BILIRUBIN TOTAL: 0.5 mg/dL (ref 0.0–1.2)
BUN / CREAT RATIO: 18 (ref 9–23)
BUN: 13 mg/dL (ref 6–24)
CHLORIDE: 99 mmol/L (ref 96–106)
CO2: 24 mmol/L (ref 20–29)
Calcium: 10.1 mg/dL (ref 8.7–10.2)
Creatinine, Ser: 0.72 mg/dL (ref 0.57–1.00)
GFR calc Af Amer: 109 mL/min/{1.73_m2} (ref >59)
GFR calc non Af Amer: 95 mL/min/{1.73_m2} (ref >59)
GLOBULIN, TOTAL: 2.4 g/dL (ref 1.5–4.5)
Glucose: 108 mg/dL — ABNORMAL HIGH (ref 65–99)
POTASSIUM: 3.9 mmol/L (ref 3.5–5.2)
SODIUM: 140 mmol/L (ref 134–144)
Total Protein: 7 g/dL (ref 6.0–8.5)

## 2016-08-03 LAB — LDL CHOLESTEROL, DIRECT: LDL DIRECT: 68 mg/dL (ref 0–99)

## 2016-08-15 ENCOUNTER — Encounter: Payer: Self-pay | Admitting: *Deleted

## 2016-08-23 ENCOUNTER — Telehealth: Payer: Self-pay | Admitting: Internal Medicine

## 2016-08-23 ENCOUNTER — Encounter: Payer: Self-pay | Admitting: *Deleted

## 2016-08-23 NOTE — Telephone Encounter (Signed)
Patient cancelled nm study in June .   Does this need to be r/s or removed from WQ ?

## 2016-08-23 NOTE — Telephone Encounter (Signed)
Patient already had test done on 06/23/16. Duplicate order discontinued.

## 2016-12-13 ENCOUNTER — Encounter: Payer: Self-pay | Admitting: Internal Medicine

## 2017-08-27 ENCOUNTER — Ambulatory Visit: Payer: Self-pay | Attending: Oncology

## 2018-03-04 ENCOUNTER — Ambulatory Visit: Payer: Self-pay | Attending: Oncology

## 2018-04-10 ENCOUNTER — Ambulatory Visit: Payer: Self-pay

## 2018-04-10 ENCOUNTER — Ambulatory Visit: Payer: Self-pay | Admitting: Obstetrics & Gynecology

## 2018-06-19 ENCOUNTER — Telehealth: Payer: Self-pay | Admitting: *Deleted

## 2018-06-19 NOTE — Telephone Encounter (Signed)
Called patient using [employer] [phone].     Virtual Visit Pre-Appointment Phone Call  "(Name), I am calling you today to discuss your upcoming appointment. We are currently trying to limit exposure to the virus that causes COVID-19 by seeing patients at home rather than in the office."  1. "What is the BEST phone number to call the day of the visit?" - include this in appointment notes  2. "Do you have or have access to (through a family member/friend) a smartphone with video capability that we can use for your visit?"  a. If no - list the appointment type as a PHONE visit in appointment notes  3. Confirm consent - "In the setting of the current Covid19 crisis, you are scheduled for a (phone or video) visit with your provider on (date) at (time).  Just as we do with many in-office visits, in order for you to participate in this visit, we must obtain consent.  If you'd like, I can send this to your mychart (if signed up) or email for you to review.  Otherwise, I can obtain your verbal consent now.  All virtual visits are billed to your insurance company just like a normal visit would be.  By agreeing to a virtual visit, we'd like you to understand that the technology does not allow for your provider to perform an examination, and thus may limit your provider's ability to fully assess your condition. If your provider identifies any concerns that need to be evaluated in person, we will make arrangements to do so.  Finally, though the technology is pretty good, we cannot assure that it will always work on either your or our end, and in the setting of a video visit, we may have to convert it to a phone-only visit.  In either situation, we cannot ensure that we have a secure connection.  Are you willing to proceed?" STAFF: Did the patient verbally acknowledge consent to telehealth visit? Document YES/NO here: yes  4. Advise patient to be prepared - "Two hours prior to your appointment, go  ahead and check your blood pressure, pulse, oxygen saturation, and your weight (if you have the equipment to check those) and write them all down. When your visit starts, your provider will ask you for this information. If you have an Apple Watch or Kardia device, please plan to have heart rate information ready on the day of your appointment. Please have a pen and paper handy nearby the day of the visit as well."  ONLY HAS A WEIGHT SCALE.  NO BP CUFF.  5. Give patient instructions for MyChart download to smartphone OR [email or web address] as below if video visit (depending on what platform provider is using)  WILL BE BY TELEPHONE.  6. Inform patient they will receive a phone call 15 minutes prior to their appointment time (may be from unknown caller ID) so they should be prepared to answer    TELEPHONE CALL NOTE  Dieu Minion has been deemed a candidate for a follow-up tele-health visit to limit community exposure during the Covid-19 pandemic. I spoke with the patient via phone to ensure availability of phone/video source, confirm preferred email & phone number, and discuss instructions and expectations.  I reminded Luzia Hacker to be prepared with any vital sign and/or heart rhythm information that could potentially be obtained via home monitoring, at the time of her visit. I reminded Natayla Tussing to expect a phone call prior to her visit.  Catalina Gravellark, Edmon Magid Owens, RN 06/19/2018 1:32 PM

## 2018-06-21 ENCOUNTER — Encounter: Payer: Self-pay | Admitting: Internal Medicine

## 2018-06-21 ENCOUNTER — Telehealth (INDEPENDENT_AMBULATORY_CARE_PROVIDER_SITE_OTHER): Payer: Self-pay | Admitting: Internal Medicine

## 2018-06-21 ENCOUNTER — Other Ambulatory Visit: Payer: Self-pay

## 2018-06-21 VITALS — Ht 63.0 in | Wt 146.0 lb

## 2018-06-21 DIAGNOSIS — R000 Tachycardia, unspecified: Secondary | ICD-10-CM

## 2018-06-21 DIAGNOSIS — R0789 Other chest pain: Secondary | ICD-10-CM

## 2018-06-21 DIAGNOSIS — I10 Essential (primary) hypertension: Secondary | ICD-10-CM

## 2018-06-21 DIAGNOSIS — I6521 Occlusion and stenosis of right carotid artery: Secondary | ICD-10-CM

## 2018-06-21 NOTE — Progress Notes (Signed)
Virtual Visit via Telephone Note   This visit type was conducted due to national recommendations for restrictions regarding the COVID-19 Pandemic (e.g. social distancing) in an effort to limit this patient's exposure and mitigate transmission in our community.  Due to her co-morbid illnesses, this patient is at least at moderate risk for complications without adequate follow up.  This format is felt to be most appropriate for this patient at this time.  The patient did not have access to video technology/had technical difficulties with video requiring transitioning to audio format only (telephone).  All issues noted in this document were discussed and addressed.  No physical exam could be performed with this format.  Please refer to the patient's chart for her  consent to telehealth for [facility].  Visit was performed with assistance of Spanish interpreter over the phone.  Date:  06/21/2018   ID:  Victoria Alvarado, DOB 05/27/1960, MRN [id number]  Patient Location: Home Provider Location: Office  PCP:  Victoria HammansSoles, Meredith Key, MD  Cardiologist:  Victoria Kendallhristopher Kruz Chiu, MD Electrophysiologist:  None   Evaluation Performed:  Follow-Up Visit  Chief Complaint:  Follow-up tachycardia and chest pain  History of Present Illness:    Victoria Alvarado is a 58 y.o. female with history of hypertension.  I last saw her in 07/2016 for follow-up of chest pain and sinus tachycardia.  Prior cardiovascular work-up was unremarkable other than mild mitral regurgitation and mild right internal carotid artery stenosis.  The medication changes or pursue further testing at that time.  Today, Victoria Alvarado reports feeling well.  She has not had any significant shortness of breath, palpitations, or lightheadedness since our last visit.  She continues to have rare episodes of transient pain under the left breast that is non-exertional and is less frequent than when we last saw each other.  She reports  that her blood pressure is typically well-controlled, though it was elevated in February after having run out of her medications for ~1 week.  She is back on her regimen of atenolol and HCTZ.  She has stopped taking aspirin and atorvastatin, though she did not have any side effects from either medication.  The patient does not have symptoms concerning for COVID-19 infection (fever, chills, cough, or new shortness of breath).    Past Medical History:  Diagnosis Date  . Carotid artery stenosis    Mild right ICA stenosis (<40%)  . Hypertension   . Mitral regurgitation 05/2016   Mild   Past Surgical History:  Procedure Laterality Date  . FINGER SURGERY       Current Meds  Medication Sig  . atenolol (TENORMIN) 25 MG tablet Take 1 tablet (25 mg total) by mouth daily.  . hydrochlorothiazide (HYDRODIURIL) 25 MG tablet Take 25 mg by mouth daily.  . Omega-3 Fatty Acids (FISH OIL PO) Take 2 capsules by mouth 2 (two) times a day.     Allergies:   Patient has no known allergies.   Social History   Tobacco Use  . Smoking status: Passive Smoke Exposure - Never Smoker  . Smokeless tobacco: Never Used  Substance Use Topics  . Alcohol use: Yes    Alcohol/week: 1.0 standard drinks    Types: 1 Cans of beer per week  . Drug use: No     Family Hx: The patient's family history includes Heart Problems in her mother; Heart attack (age of onset: 5772) in her father; Stroke in her mother; Thyroid cancer in her mother.  ROS:   Please see the history of present illness.   All other systems reviewed and are negative.   Prior CV studies:   The following studies were reviewed today:   Pharmacologic MPI (06/23/16): Normal study without ischemia or scar. Normal LVEF.  Exercise tolerance test (06/05/16): Limited exercise capacity with inability to reach stage II of the Bruce protocol. No significant ST segment/T-wave changes identified. Given poor exercise capacity, intermediate risk test.  TTE  (05/22/16): Normal LV size and function with LVEF of 60-65%. No wall motion abnormalities or diastolic dysfunction. Mild MR. Normal RV size and function.  Carotid Doppler (05/22/16): Mild right internal carotid artery stenosis (1-39%). Normal left internal carotid artery. Antegrade vertebral artery flow bilaterally. Normal subclavian artery flow.  Labs/Other Tests and Data Reviewed:    EKG:  No ECG reviewed.  Recent Labs: No results found for requested labs within last 8760 hours.   Recent Lipid Panel Lab Results  Component Value Date/Time   CHOL 146 08/02/2016 09:24 AM   TRIG 111 08/02/2016 09:24 AM   HDL 64 08/02/2016 09:24 AM   CHOLHDL 2.3 08/02/2016 09:24 AM   LDLCALC 60 08/02/2016 09:24 AM   LDLDIRECT 68 08/02/2016 09:24 AM    Wt Readings from Last 3 Encounters:  06/21/18 146 lb (66.2 kg)  08/02/16 144 lb 8 oz (65.5 kg)  07/14/16 148 lb (67.1 kg)     Objective:    Vital Signs:  Ht 5\' 3"  (1.6 m)   Wt 146 lb (66.2 kg)   BMI 25.86 kg/m    VITAL SIGNS:  reviewed  ASSESSMENT & PLAN:    Atypical chest pain: Stable to improved since our last visit.  Prior workup, including echo and myocardial perfusion stress test, were reassuring.  No further testing at this time.  Given history of mild right internal carotid artery plaquing, I have recommended reinitiation of ASA 81 mg daily.  Carotid artery stenosis: Mild right ICU stenosis (1-39%) noted in 2018.  Victoria Alvarado does not have any neurologic symptoms.  I encouraged her to restart aspirin.  She also has stopped taking atorvastatin; LDL was previously well controlled on this.  She is scheduled for a physical and labs with her PCP in the near future (has been delayed due to COVID-19).  Lipid panel should be checked at that time and atorvastatin restarted if LDL is greater than 70.  Hypertension: Victoria Alvarado reports good blood pressure control when her blood pressure is checked by other providers or at the  pharmacy (though she does not recall specific values).  She should continue atenolol and HCTZ a and follow-up with her PCP for ongoing management.  Tachycardia: No further episodes.  No further workup or intervention is recommended unless symptoms recur.  COVID-19 Education: The signs and symptoms of COVID-19 were discussed with the patient and how to seek care for testing (follow up with PCP or arrange E-visit).  The importance of social distancing was discussed today.  Time:   Today, I have spent 15 minutes with the patient with telehealth technology discussing the above problems.     Medication Adjustments/Labs and Tests Ordered: Current medicines are reviewed at length with the patient today.  Concerns regarding medicines are outlined above.   Tests Ordered: None.  Medication Changes: Restart aspirin 81 mg daily.  Disposition:  Follow up in 1 year(s)  Signed, Victoria Kendall, MD  06/21/2018 9:37 AM    [facility]

## 2018-06-21 NOTE — Patient Instructions (Signed)
Medication Instructions:  Your physician has recommended you make the following change in your medication:  1- RESTART Aspirin 81 mg by mouth once a day (get this over the counter at drugstore).  If you need a refill on your cardiac medications before your next appointment, please call your pharmacy.   Lab work: Labwork will be requested from your primary care physician. Please have your primary care provider fax results of lab work (especially for cholesterol) to our office at [phone] (fax).  If you have labs (blood work) drawn today and your tests are completely normal, you will receive your results only by: Marland Kitchen MyChart Message (if you have MyChart) OR . A paper copy in the mail If you have any lab test that is abnormal or we need to change your treatment, we will call you to review the results.  Testing/Procedures: none  Follow-Up: At [facility], you and your health needs are our priority.  As part of our continuing mission to provide you with exceptional heart care, we have created designated Provider Care Teams.  These Care Teams include your primary Cardiologist (physician) and Advanced Practice Providers (APPs -  Physician Assistants and Nurse Practitioners) who all work together to provide you with the care you need, when you need it. You will need a follow up appointment in 12 months.  Please call our office 2 months in advance to schedule this appointment.  You may see DR Cristal Deer END or one of the following Advanced Practice Providers on your designated Care Team:   Nicolasa Ducking, NP Eula Listen, PA-C . Marisue Ivan, PA-C

## 2018-06-22 DIAGNOSIS — R0789 Other chest pain: Secondary | ICD-10-CM | POA: Insufficient documentation

## 2018-06-22 DIAGNOSIS — I6521 Occlusion and stenosis of right carotid artery: Secondary | ICD-10-CM | POA: Insufficient documentation

## 2018-09-20 ENCOUNTER — Telehealth: Payer: Self-pay

## 2018-09-20 NOTE — Telephone Encounter (Signed)
Pre-visit assessment complete. Insurance and financial eligibility reviewed.  Spanish interpreter: Jacqui   Appt: 09/24/2018 @ 8:30am

## 2018-09-24 ENCOUNTER — Ambulatory Visit: Payer: Self-pay | Attending: Oncology

## 2018-09-24 ENCOUNTER — Other Ambulatory Visit: Payer: Self-pay

## 2018-09-24 ENCOUNTER — Ambulatory Visit
Admission: RE | Admit: 2018-09-24 | Discharge: 2018-09-24 | Disposition: A | Discharge status: Home / Self Care | Payer: Self-pay | Source: Ambulatory Visit | Attending: Oncology | Admitting: Oncology

## 2018-09-24 VITALS — BP 167/82 | HR 86 | Temp 97.8°F | Ht 61.0 in | Wt 143.6 lb

## 2018-09-24 DIAGNOSIS — Z0000 Encounter for general adult medical examination without abnormal findings: Secondary | ICD-10-CM

## 2018-09-24 NOTE — Progress Notes (Signed)
Patient ID: Victoria Alvarado, female   DOB: 08-25-1960, 58 y.o.   MRN: [id number]

## 2018-09-24 NOTE — Progress Notes (Signed)
  Subjective:     Patient ID: Victoria Alvarado, female   DOB: 05-14-1960, 58 y.o.   MRN: [id number]  HPI   Review of Systems     Objective:   Physical Exam     Assessment:     58 year old hispanic patient presents for [facility] clinic visit.  Patient screened, and meets BCCCP eligibility.  Patient does not have insurance, Medicare or Medicaid. Instructed patient on breast self awareness using teach back method. Clinical breast exam unremarkable. No mass or lump palpated.  Victoria Alvarado interpreted.    Plan:     Sent for bilateral screening mammogram.

## 2018-09-25 NOTE — Progress Notes (Signed)
Letter mailed from [facility] to notify of normal mammogram results.  Patient to return in one year for annual screening.  Copy to [facility]. 

## 2019-03-19 IMAGING — US US BREAST*R* LIMITED INC AXILLA
1 series · 6 of 6 positions shown · non-contrast
Comparison: Screening mammogram dated 05/01/2016.

CLINICAL DATA: 56-year-old female returned from screening
mammography for a possible mass in the right breast.

EXAM:
2D DIGITAL DIAGNOSTIC UNILATERAL RIGHT MAMMOGRAM WITH CAD AND
ADJUNCT TOMO
RIGHT BREAST ULTRASOUND

[Series 1: us breast*right* limited inc axilla · 0.04mm/px · 6 of 6 slices shown]
[im 1/6]
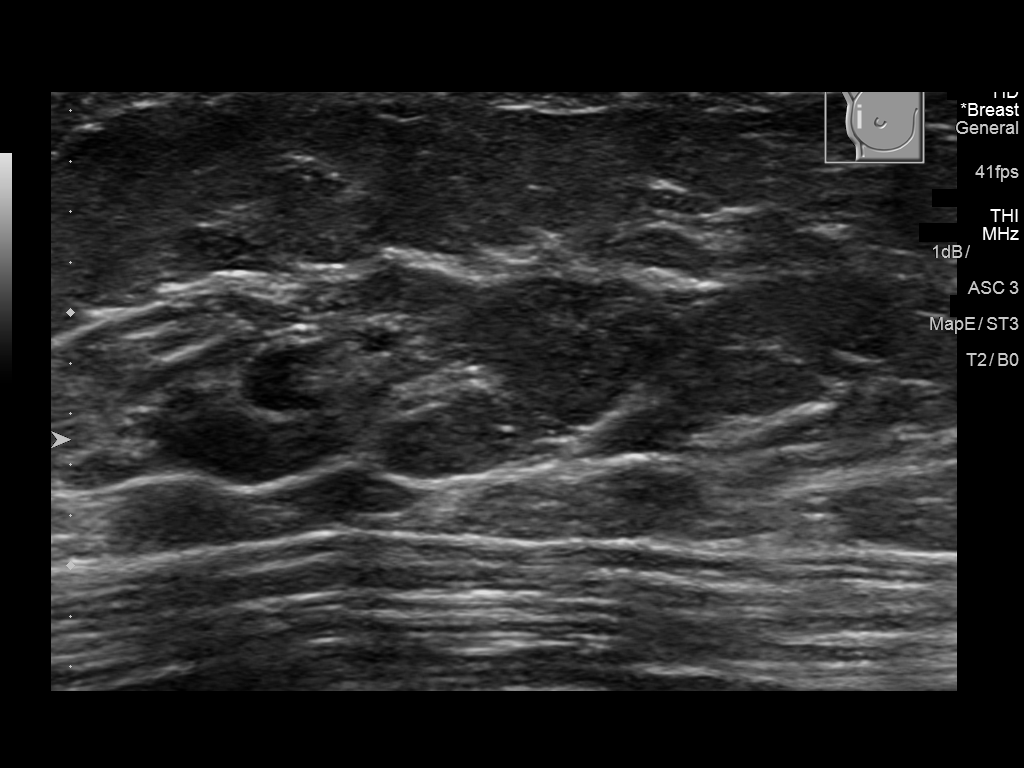
[im 2/6]
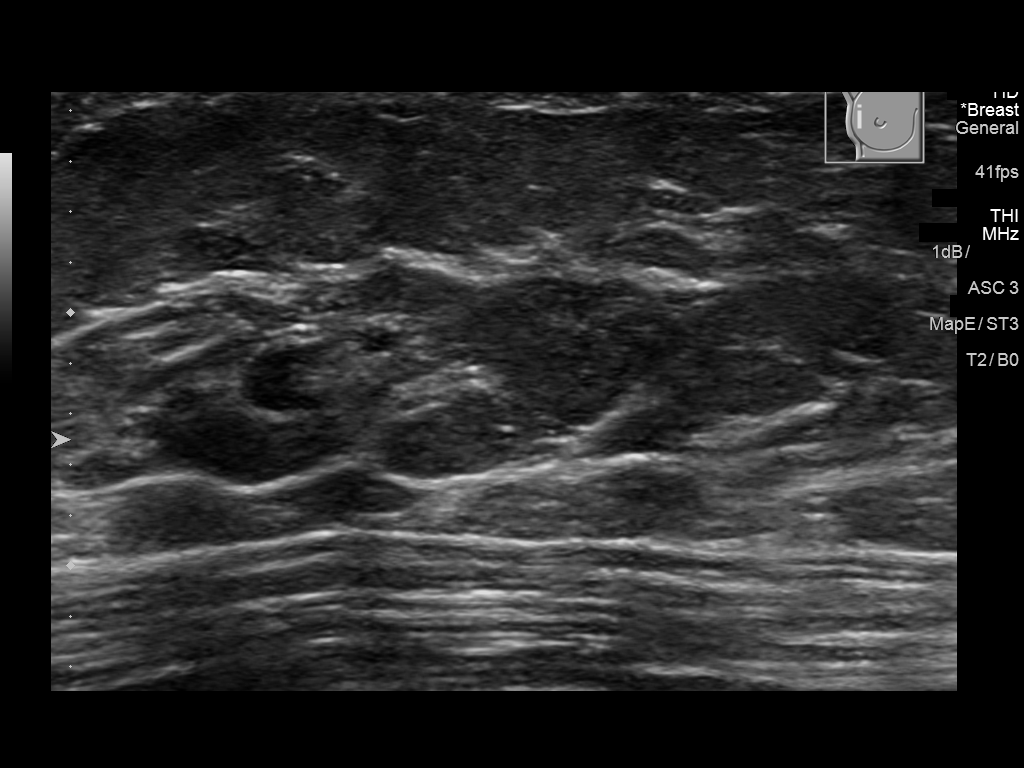
[im 3/6]
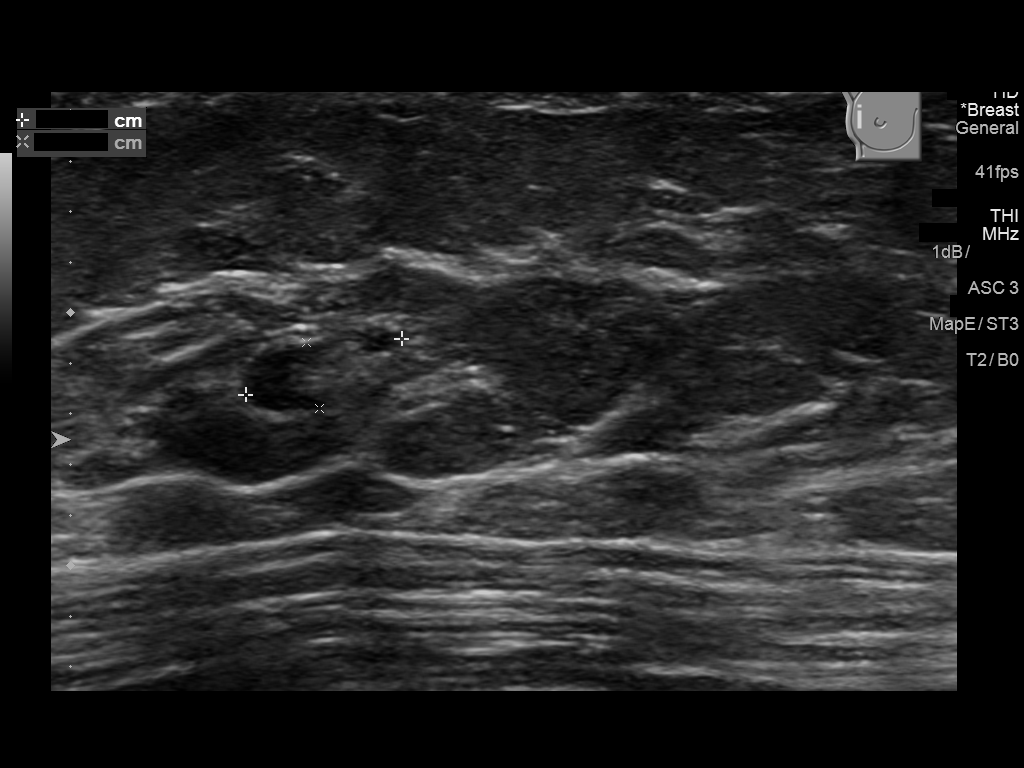
[im 4/6]
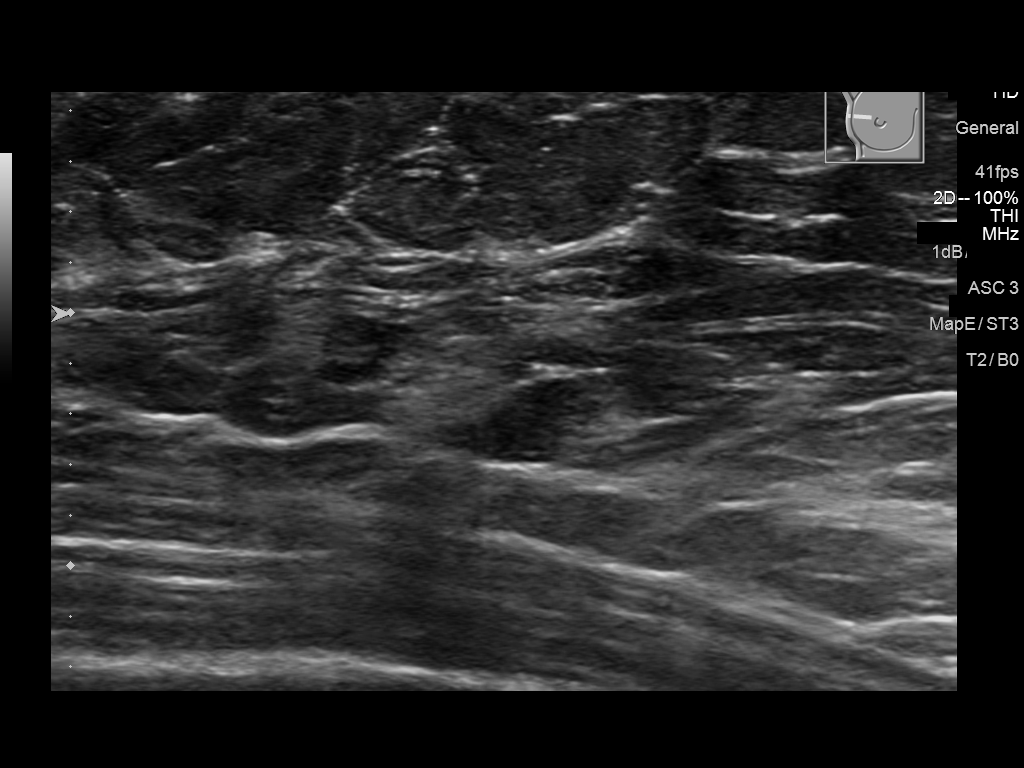
[im 5/6]
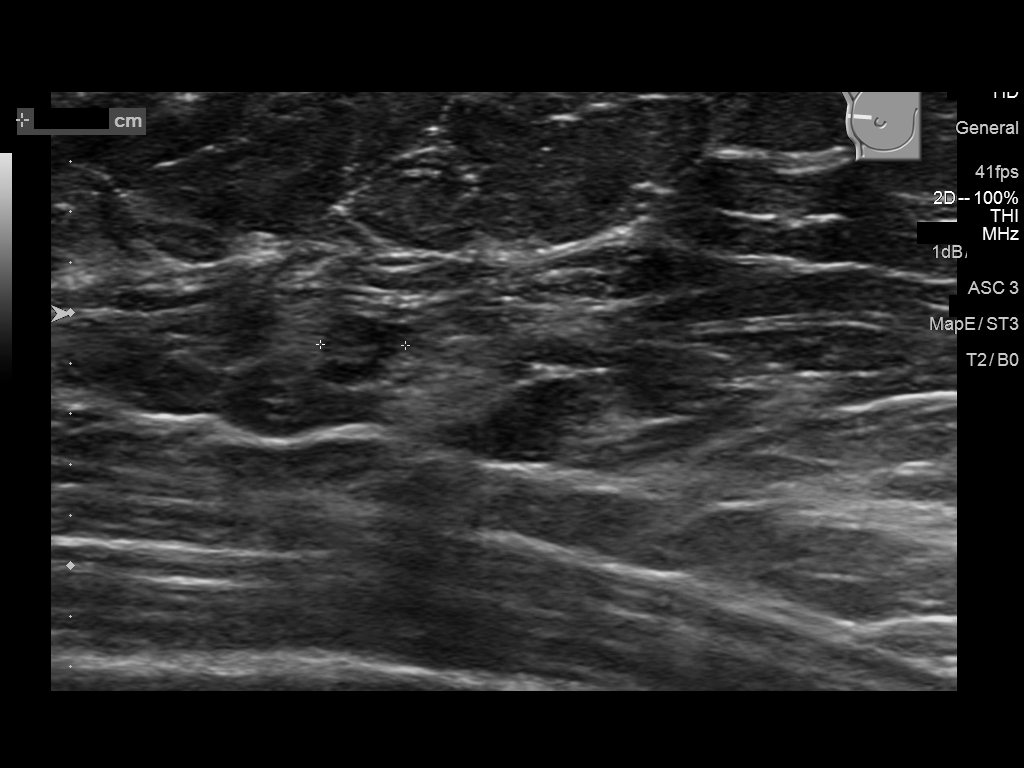
[im 6/6]
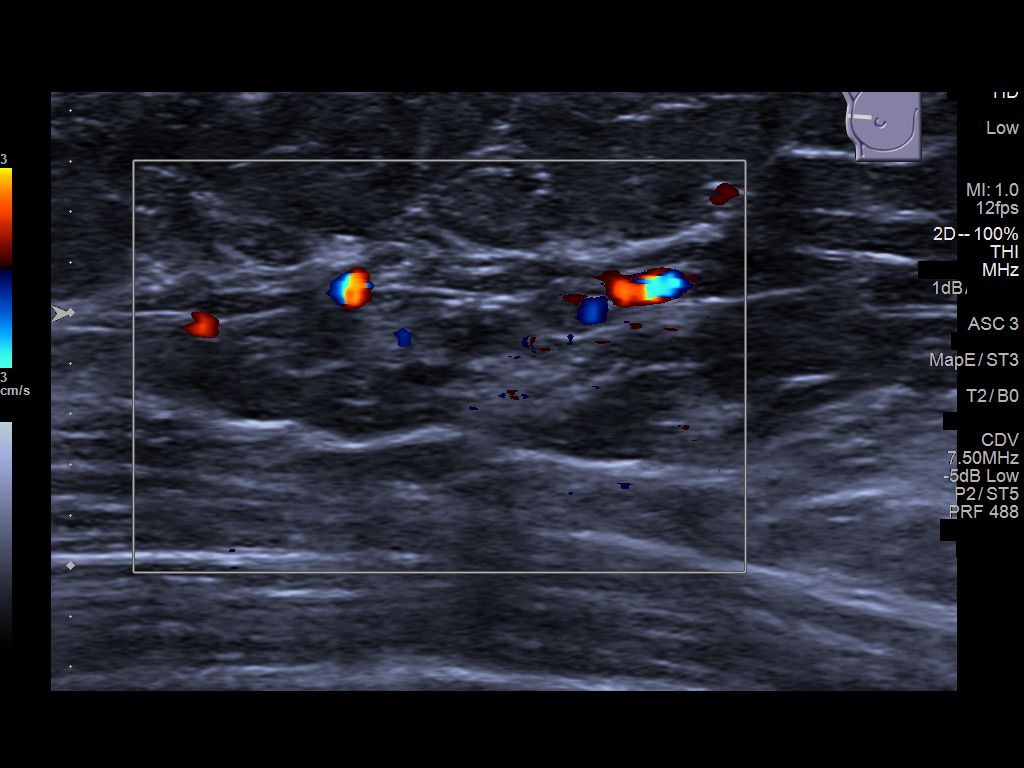

[6 of 6 positions shown; findings below may reference images not displayed]

The patient has
prior mammograms from [HOSPITAL], however these unable to be
obtained.

ACR Breast Density Category b: There are scattered areas of
fibroglandular density.
FINDINGS: There is a persistent mass in the upper-outer quadrant of the right
breast, posterior depth which measures approximately 6-7 mm.

Mammographic images were processed with CAD.

Ultrasound targeted to the right breast at 9 o'clock, 7 cm from the
nipple demonstrates a reniform hypoechoic mass measuring 7 x 3 x 3
mm consistent with a benign lymph node. This corresponds in size and
location with the mammographic mass of concern.
IMPRESSION: 1. The mass identified in the right breast at 9 o'clock is a benign
lymph node.

RECOMMENDATION:
Screening mammogram in one year.(Code:ZT-C-MSI)

I have discussed the findings and recommendations with the patient.
Results were also provided in writing at the conclusion of the
visit. If applicable, a reminder letter will be sent to the patient
regarding the next appointment.

BI-RADS CATEGORY  2: Benign.

## 2019-03-28 IMAGING — MG MM DIGITAL DIAGNOSTIC UNILAT*R* W/ TOMO W/ CAD
6 series · 6 of 14 positions shown · non-contrast
Comparison: Screening mammogram dated 05/01/2016.

CLINICAL DATA: 56-year-old female returned from screening
mammography for a possible mass in the right breast.

EXAM:
2D DIGITAL DIAGNOSTIC UNILATERAL RIGHT MAMMOGRAM WITH CAD AND
ADJUNCT TOMO
RIGHT BREAST ULTRASOUND

[R MLO]
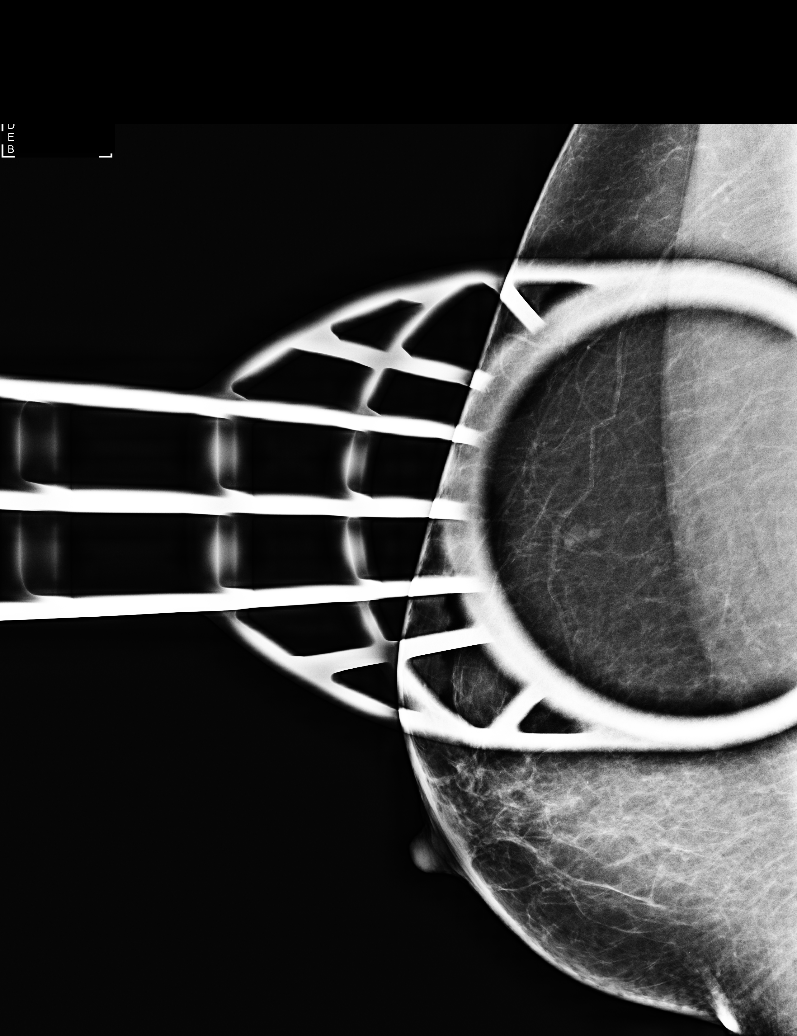

[R CC synth-2D]
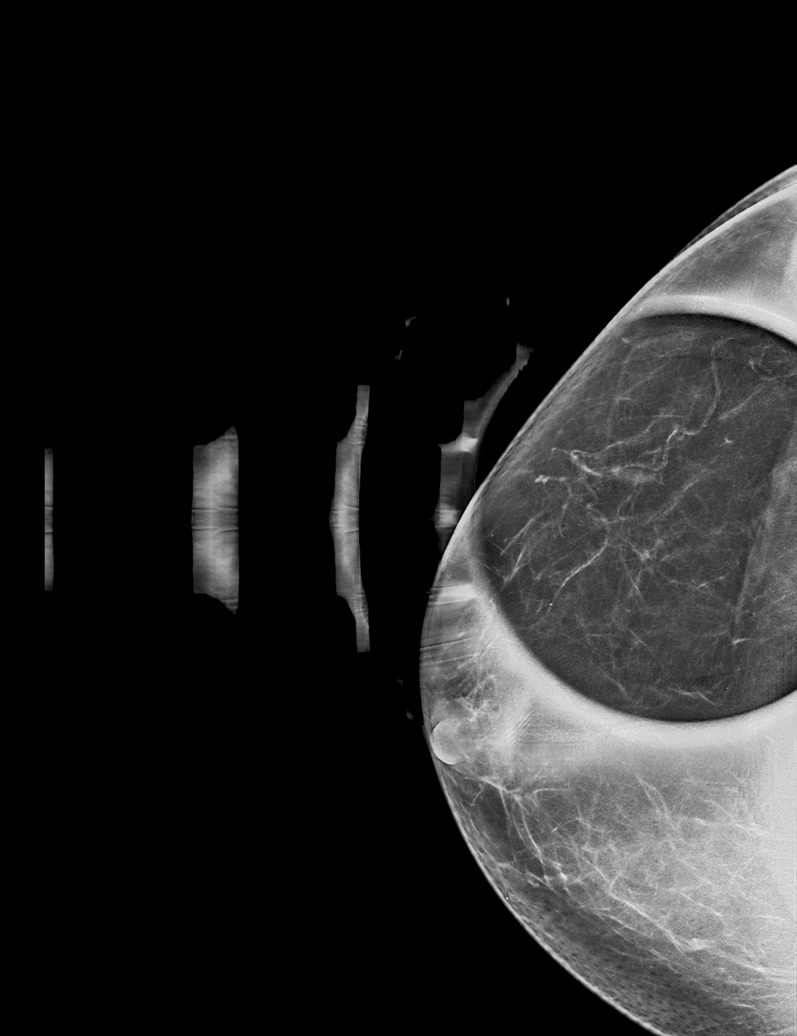

[R MLO synth-2D]
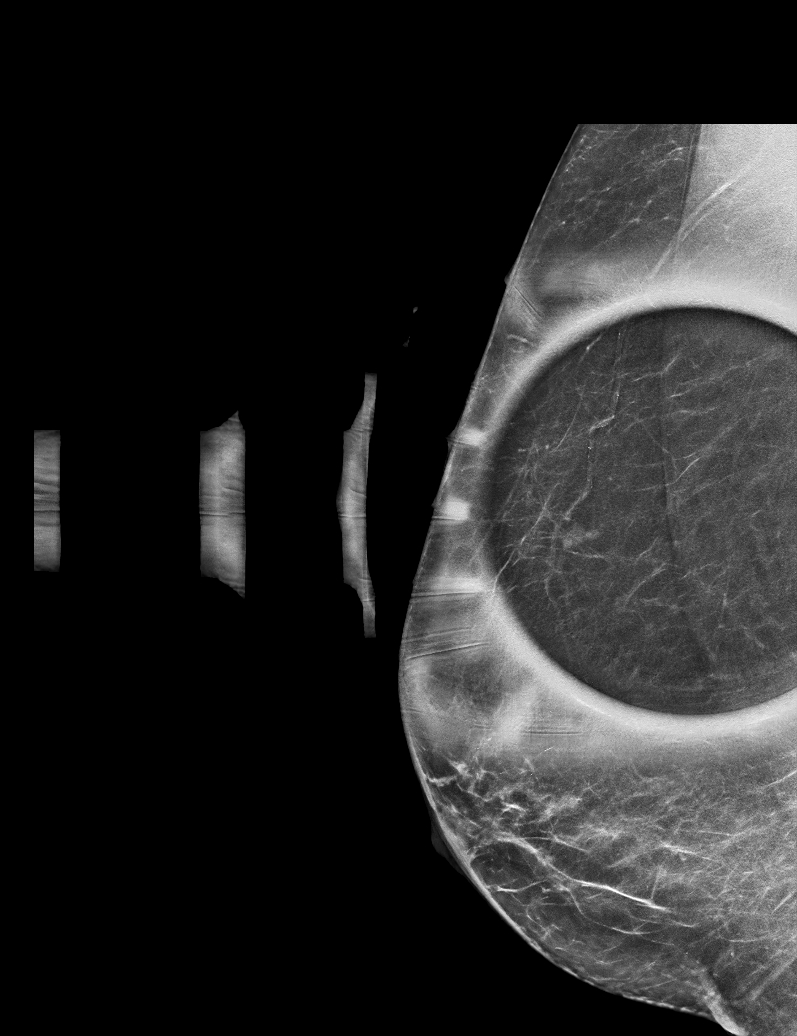

[R CC]
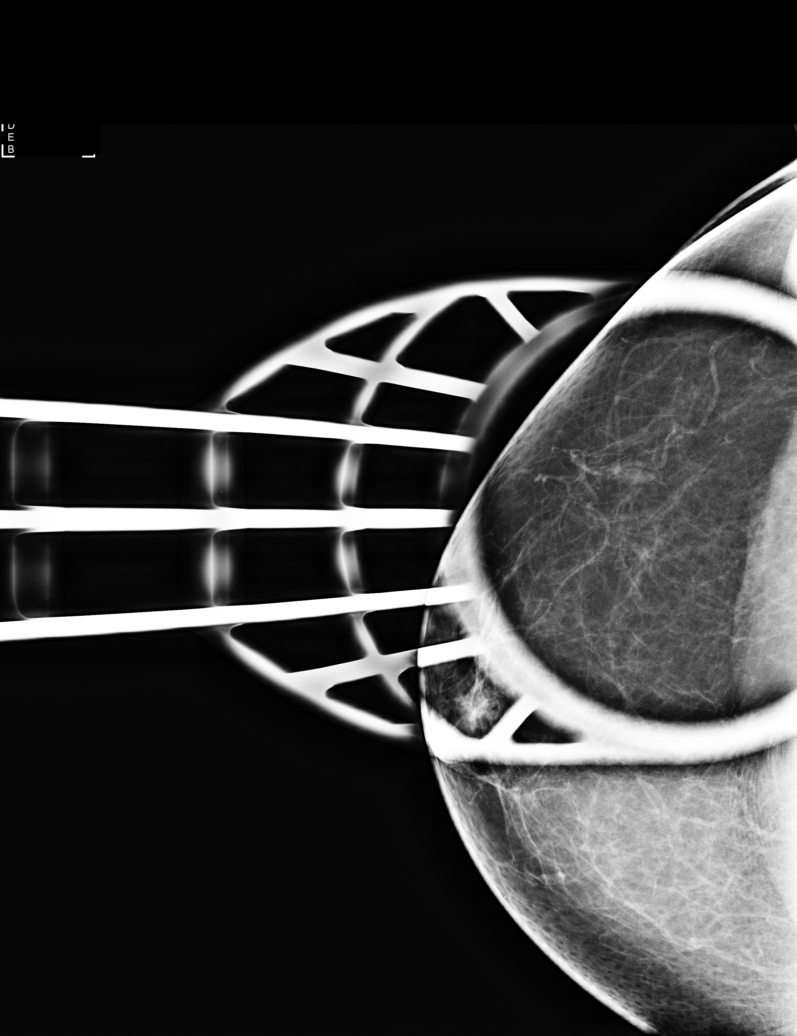

[R CC tomo · tomo slice 31/62.0]
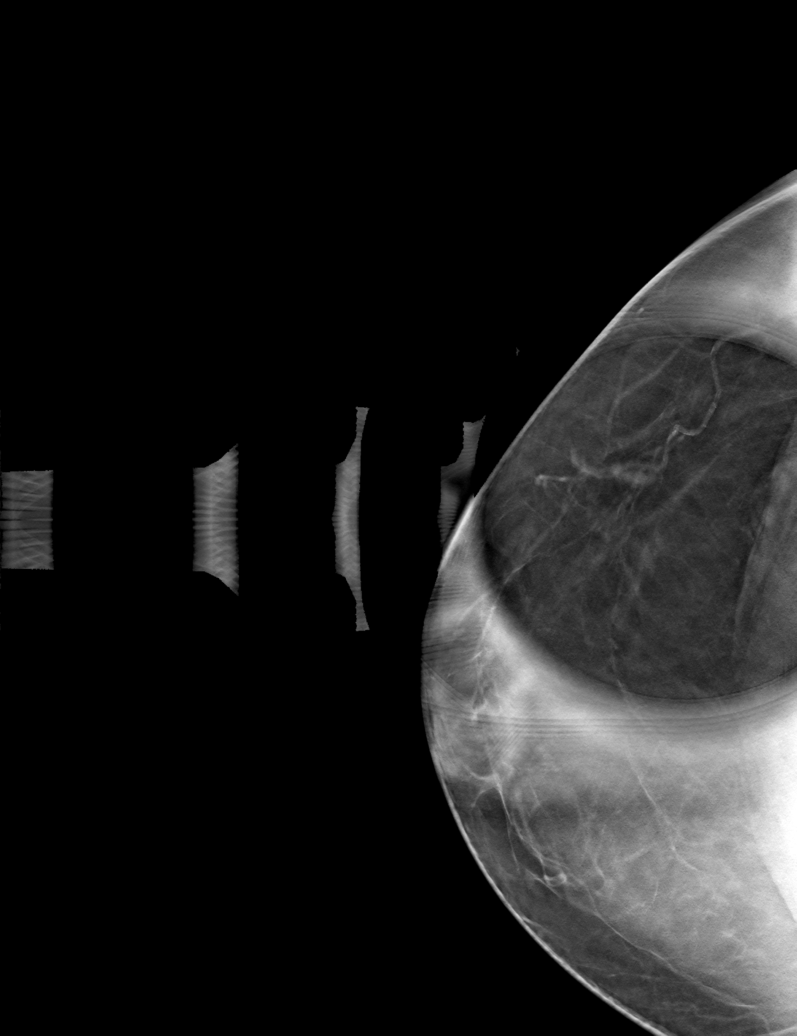

[R MLO tomo · tomo slice 31/60.0]
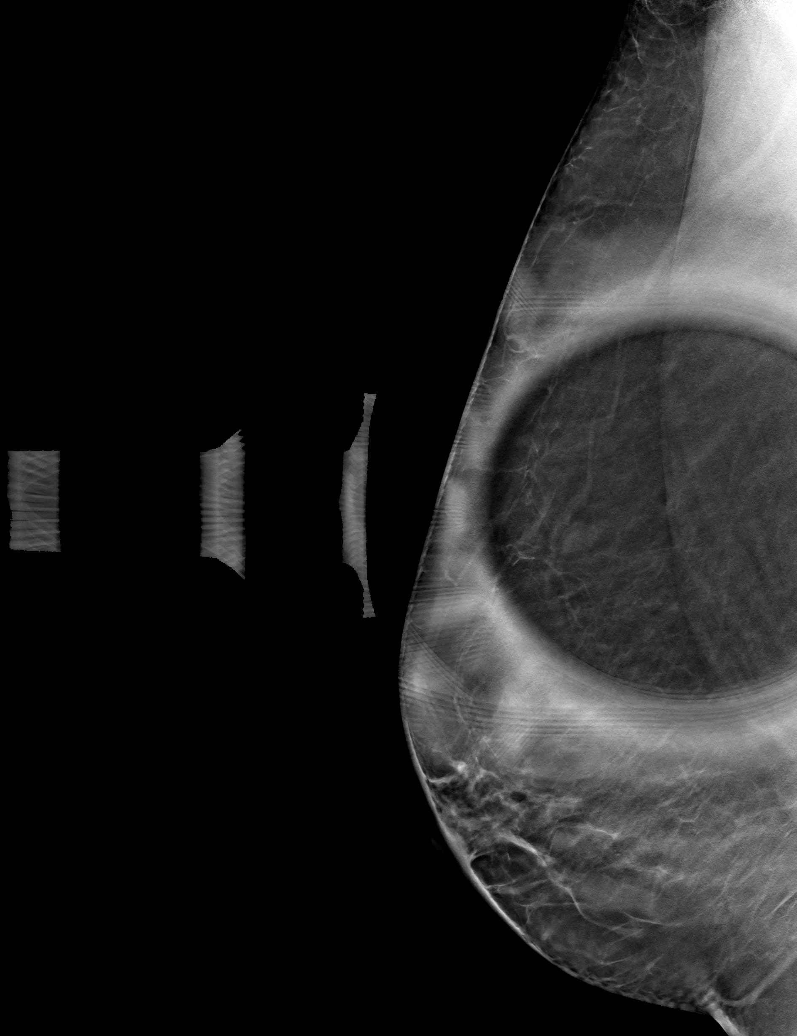

[6 of 14 positions shown; findings below may reference images not displayed]

The patient has
prior mammograms from [HOSPITAL], however these unable to be
obtained.

ACR Breast Density Category b: There are scattered areas of
fibroglandular density.
FINDINGS: There is a persistent mass in the upper-outer quadrant of the right
breast, posterior depth which measures approximately 6-7 mm.

Mammographic images were processed with CAD.

Ultrasound targeted to the right breast at 9 o'clock, 7 cm from the
nipple demonstrates a reniform hypoechoic mass measuring 7 x 3 x 3
mm consistent with a benign lymph node. This corresponds in size and
location with the mammographic mass of concern.
IMPRESSION: 1. The mass identified in the right breast at 9 o'clock is a benign
lymph node.

RECOMMENDATION:
Screening mammogram in one year.(Code:ZT-C-MSI)

I have discussed the findings and recommendations with the patient.
Results were also provided in writing at the conclusion of the
visit. If applicable, a reminder letter will be sent to the patient
regarding the next appointment.

BI-RADS CATEGORY  2: Benign.

## 2019-04-06 ENCOUNTER — Ambulatory Visit: Payer: Self-pay | Attending: Internal Medicine

## 2019-04-06 DIAGNOSIS — Z23 Encounter for immunization: Secondary | ICD-10-CM

## 2019-04-06 NOTE — Progress Notes (Signed)
   Covid-19 Vaccination Clinic  Name:  Victoria Alvarado    MRN: [id number] DOB: [birth year]  04/06/2019  Victoria Alvarado was observed post Covid-19 immunization for 15 minutes without incident. She was provided with Vaccine Information Sheet and instruction to access the V-Safe system.   Victoria Alvarado was instructed to call 911 with any severe reactions post vaccine: Marland Kitchen Difficulty breathing  . Swelling of face and throat  . A fast heartbeat  . A bad rash all over body  . Dizziness and weakness   Immunizations Administered    Name Date Dose VIS Date Route   Pfizer COVID-19 Vaccine 04/06/2019  1:08 PM 0.3 mL 12/27/2018 Intramuscular   Manufacturer: [employer], [employer]   Lot: [id number]   NDC: [address]-8

## 2019-04-27 ENCOUNTER — Ambulatory Visit: Payer: Self-pay | Attending: Internal Medicine

## 2019-04-27 DIAGNOSIS — Z23 Encounter for immunization: Secondary | ICD-10-CM

## 2019-04-27 NOTE — Progress Notes (Signed)
   [facility]  Name:  Victoria Alvarado    MRN: [id number] DOB: 1960/06/28  04/27/2019  Ms. Victoria Alvarado was observed post Covid-19 immunization for 15 minutes without incident. She was provided with Vaccine Information Sheet and instruction to access the V-Safe system.   Ms. Victoria Alvarado was instructed to call 911 with any severe reactions post vaccine: Marland Kitchen Difficulty breathing  . Swelling of face and throat  . A fast heartbeat  . A bad rash all over body  . Dizziness and weakness   Immunizations Administered    Name Date Dose VIS Date Route   Pfizer COVID-19 Vaccine 04/27/2019 12:46 PM 0.3 mL 12/27/2018 Intramuscular   Manufacturer: [employer], [employer]   Lot: [id number]   NDC: [address]-8

## 2019-06-09 ENCOUNTER — Other Ambulatory Visit: Payer: Self-pay | Admitting: Physician Assistant

## 2019-06-09 ENCOUNTER — Other Ambulatory Visit: Payer: Self-pay

## 2019-06-09 ENCOUNTER — Ambulatory Visit
Admission: RE | Admit: 2019-06-09 | Discharge: 2019-06-09 | Disposition: A | Discharge status: Home / Self Care | Payer: BC Managed Care – PPO | Attending: *Deleted | Admitting: *Deleted

## 2019-06-09 DIAGNOSIS — M25561 Pain in right knee: Secondary | ICD-10-CM

## 2019-06-26 ENCOUNTER — Ambulatory Visit
Admission: RE | Admit: 2019-06-26 | Discharge: 2019-06-26 | Disposition: A | Discharge status: Home / Self Care | Payer: BC Managed Care – PPO | Source: Ambulatory Visit | Attending: Physician Assistant | Admitting: Physician Assistant

## 2019-06-26 ENCOUNTER — Other Ambulatory Visit: Payer: Self-pay

## 2019-06-26 DIAGNOSIS — M25561 Pain in right knee: Secondary | ICD-10-CM | POA: Insufficient documentation

## 2019-08-18 ENCOUNTER — Other Ambulatory Visit: Payer: Self-pay | Admitting: Family Medicine

## 2019-08-18 DIAGNOSIS — Z1231 Encounter for screening mammogram for malignant neoplasm of breast: Secondary | ICD-10-CM

## 2019-10-01 ENCOUNTER — Other Ambulatory Visit: Payer: Self-pay

## 2019-10-01 ENCOUNTER — Ambulatory Visit
Admission: RE | Admit: 2019-10-01 | Discharge: 2019-10-01 | Disposition: A | Discharge status: Home / Self Care | Payer: BC Managed Care – PPO | Source: Ambulatory Visit | Attending: Family Medicine | Admitting: Family Medicine

## 2019-10-01 DIAGNOSIS — Z1231 Encounter for screening mammogram for malignant neoplasm of breast: Secondary | ICD-10-CM | POA: Diagnosis present

## 2020-02-26 ENCOUNTER — Telehealth: Payer: Self-pay | Admitting: Internal Medicine

## 2020-02-26 NOTE — Telephone Encounter (Signed)
Patient has been contacted at least 3 times for a recall, recall has been deleted
